# Patient Record
Sex: Male | Born: 1950
Health system: Southern US, Community
[De-identification: ages and names within clinical notes are randomized; demographics above are authoritative.]

## PROBLEM LIST (undated history)

## (undated) HISTORY — PX: VASECTOMY: SHX75

## (undated) HISTORY — PX: OTHER SURGICAL HISTORY: SHX169

---

## 2006-11-07 ENCOUNTER — Ambulatory Visit: Payer: Self-pay | Admitting: Internal Medicine

## 2006-11-07 LAB — CONVERTED CEMR LAB
ALT: 17 units/L (ref 0–40)
AST: 17 units/L (ref 0–37)
Albumin: 3.9 g/dL (ref 3.5–5.2)
Alkaline Phosphatase: 61 units/L (ref 39–117)
Basophils Relative: 0.9 % (ref 0.0–1.0)
Eosinophil percent: 0.9 % (ref 0.0–5.0)
GFR calc non Af Amer: 93 mL/min
Glomerular Filtration Rate, Af Am: 112 mL/min/{1.73_m2}
HCT: 43.3 % (ref 39.0–52.0)
HDL: 46.9 mg/dL (ref >39.0)
PSA: 1.52 ng/mL (ref 0.10–4.00)
RBC: 4.75 M/uL (ref 4.22–5.81)
RDW: 13.1 % (ref 11.5–14.6)
Sodium: 142 meq/L (ref 135–145)
Total Bilirubin: 1.3 mg/dL — ABNORMAL HIGH (ref 0.3–1.2)
Total Protein: 6.8 g/dL (ref 6.0–8.3)
VLDL: 15 mg/dL (ref 0–40)
WBC: 10.2 10*3/uL (ref 4.5–10.5)

## 2006-11-14 ENCOUNTER — Ambulatory Visit: Payer: Self-pay | Admitting: Internal Medicine

## 2006-11-22 ENCOUNTER — Ambulatory Visit: Payer: Self-pay | Admitting: Gastroenterology

## 2006-12-02 ENCOUNTER — Ambulatory Visit: Payer: Self-pay | Admitting: Gastroenterology

## 2006-12-02 ENCOUNTER — Encounter: Payer: Self-pay | Admitting: Internal Medicine

## 2006-12-02 HISTORY — PX: COLONOSCOPY: SHX174

## 2008-09-29 ENCOUNTER — Ambulatory Visit: Payer: Self-pay | Admitting: Internal Medicine

## 2008-09-29 DIAGNOSIS — R0609 Other forms of dyspnea: Secondary | ICD-10-CM

## 2008-09-29 DIAGNOSIS — R0989 Other specified symptoms and signs involving the circulatory and respiratory systems: Secondary | ICD-10-CM

## 2008-09-29 DIAGNOSIS — R209 Unspecified disturbances of skin sensation: Secondary | ICD-10-CM | POA: Insufficient documentation

## 2008-10-20 ENCOUNTER — Ambulatory Visit (HOSPITAL_BASED_OUTPATIENT_CLINIC_OR_DEPARTMENT_OTHER): Admission: RE | Admit: 2008-10-20 | Discharge: 2008-10-20 | Payer: Self-pay | Admitting: Internal Medicine

## 2008-10-20 ENCOUNTER — Encounter: Payer: Self-pay | Admitting: Internal Medicine

## 2008-11-04 ENCOUNTER — Ambulatory Visit: Payer: Self-pay | Admitting: Pulmonary Disease

## 2008-12-24 ENCOUNTER — Telehealth (INDEPENDENT_AMBULATORY_CARE_PROVIDER_SITE_OTHER): Payer: Self-pay | Admitting: *Deleted

## 2009-01-04 ENCOUNTER — Encounter: Payer: Self-pay | Admitting: Internal Medicine

## 2010-05-02 ENCOUNTER — Ambulatory Visit: Payer: Self-pay | Admitting: Internal Medicine

## 2010-05-02 DIAGNOSIS — R519 Headache, unspecified: Secondary | ICD-10-CM | POA: Insufficient documentation

## 2010-05-02 DIAGNOSIS — R51 Headache: Secondary | ICD-10-CM | POA: Insufficient documentation

## 2010-08-30 ENCOUNTER — Ambulatory Visit: Payer: Self-pay | Admitting: Internal Medicine

## 2010-08-30 DIAGNOSIS — T169XXA Foreign body in ear, unspecified ear, initial encounter: Secondary | ICD-10-CM

## 2010-08-30 LAB — CONVERTED CEMR LAB
ALT: 21 units/L (ref 0–53)
AST: 20 units/L (ref 0–37)
Basophils Relative: 0.7 % (ref 0.0–3.0)
Bilirubin, Direct: 0.1 mg/dL (ref 0.0–0.3)
Chloride: 101 meq/L (ref 96–112)
Cholesterol: 179 mg/dL (ref 0–200)
Creatinine, Ser: 0.8 mg/dL (ref 0.4–1.5)
Eosinophils Relative: 1.4 % (ref 0.0–5.0)
GFR calc non Af Amer: 103.07 mL/min (ref >60)
HCT: 42.8 % (ref 39.0–52.0)
Hemoglobin, Urine: NEGATIVE
LDL Cholesterol: 109 mg/dL — ABNORMAL HIGH (ref 0–99)
Leukocytes, UA: NEGATIVE
MCV: 92.7 fL (ref 78.0–100.0)
Monocytes Absolute: 0.7 10*3/uL (ref 0.1–1.0)
Monocytes Relative: 8.9 % (ref 3.0–12.0)
Neutrophils Relative %: 50.8 % (ref 43.0–77.0)
Nitrite: NEGATIVE
Potassium: 4.5 meq/L (ref 3.5–5.1)
RBC: 4.62 M/uL (ref 4.22–5.81)
Specific Gravity, Urine: 1.015 (ref 1.000–1.030)
Total Bilirubin: 1.2 mg/dL (ref 0.3–1.2)
Total CHOL/HDL Ratio: 3
Total Protein: 6.4 g/dL (ref 6.0–8.3)
Urine Glucose: NEGATIVE mg/dL
Urobilinogen, UA: 0.2 (ref 0.0–1.0)
VLDL: 13.4 mg/dL (ref 0.0–40.0)
WBC: 7.7 10*3/uL (ref 4.5–10.5)

## 2010-09-12 ENCOUNTER — Encounter: Payer: Self-pay | Admitting: Internal Medicine

## 2010-09-12 ENCOUNTER — Ambulatory Visit: Payer: Self-pay | Admitting: Internal Medicine

## 2010-12-05 NOTE — Assessment & Plan Note (Signed)
Summary: CPX // RS   Vital Signs:  Patient profile:   60 year old male Height:      71 inches Weight:      184 pounds Temp:     98.4 degrees F oral BP sitting:   122 / 74  (left arm) Cuff size:   regular  Vitals Entered By: Duard Brady LPN (September 12, 2010 1:19 PM) CC: cpx - doing well  Is Patient Diabetic? No   CC:  cpx - doing well .  History of Present Illness: 60 year -old patient who is seen today for a wellness exam.  He enjoys excellent health  Preventive Screening-Counseling & Management  Alcohol-Tobacco     Smoking Status: never  Allergies (verified): No Known Drug Allergies  Past History:  Past Surgical History: Colonoscopy-12/02/2006 Vasectomy spermatocele repair  Family History: Reviewed history from 05/28/2007 and no changes required. Family History High cholesterol Family History Hypertension Family History of Cardiovascular disorder Fam hx Stroke Family History of Aneurysm Aortic (Father) Family History Lung cancer Father died 94-  CVD Mother- Ca Pancreas   (5) 1   Brother- good health  Social History: Reviewed history from 05/28/2007 and no changes required. Married Former Smoker (d/c [phone] yrs) Alcohol use-yes Drug use-no Regular exercise- yardwork   Review of Systems  The patient denies anorexia, fever, weight loss, weight gain, vision loss, decreased hearing, hoarseness, chest pain, syncope, dyspnea on exertion, peripheral edema, prolonged cough, headaches, hemoptysis, abdominal pain, melena, hematochezia, severe indigestion/heartburn, hematuria, incontinence, genital sores, muscle weakness, suspicious skin lesions, transient blindness, difficulty walking, depression, unusual weight change, abnormal bleeding, enlarged lymph nodes, angioedema, breast masses, and testicular masses.    Physical Exam  General:  Well-developed,well-nourished,in no acute distress; alert,appropriate and cooperative throughout examination Head:   Normocephalic and atraumatic without obvious abnormalities. No apparent alopecia or balding. Eyes:  No corneal or conjunctival inflammation noted. EOMI. Perrla. Funduscopic exam benign, without hemorrhages, exudates or papilledema. Vision grossly normal. Ears:  External ear exam shows no significant lesions or deformities.  Otoscopic examination reveals clear canals, tympanic membranes are intact bilaterally without bulging, retraction, inflammation or discharge. Hearing is grossly normal bilaterally. Nose:  External nasal examination shows no deformity or inflammation. Nasal mucosa are pink and moist without lesions or exudates. Mouth:  Oral mucosa and oropharynx without lesions or exudates.  Teeth in good repair. Neck:  No deformities, masses, or tenderness noted. Chest Wall:  No deformities, masses, tenderness or gynecomastia noted. Breasts:  No masses or gynecomastia noted Lungs:  Normal respiratory effort, chest expands symmetrically. Lungs are clear to auscultation, no crackles or wheezes. Heart:  Normal rate and regular rhythm. S1 and S2 normal without gallop, murmur, click, rub or other extra sounds. Abdomen:  Bowel sounds positive,abdomen soft and non-tender without masses, organomegaly or hernias noted. Rectal:  No external abnormalities noted. Normal sphincter tone. No rectal masses or tenderness. Genitalia:  Testes bilaterally descended without nodularity, tenderness or masses. No scrotal masses or lesions. No penis lesions or urethral discharge. Prostate:  1+ enlarged.  1+ enlarged.   Msk:  No deformity or scoliosis noted of thoracic or lumbar spine.   Pulses:  R and L carotid,radial,femoral,dorsalis pedis and posterior tibial pulses are full and equal bilaterally Extremities:  No clubbing, cyanosis, edema, or deformity noted with normal full range of motion of all joints.   Neurologic:  No cranial nerve deficits noted. Station and gait are normal. Plantar reflexes are down-going  bilaterally. DTRs are symmetrical throughout. Sensory, motor  and coordinative functions appear intact. Skin:  Intact without suspicious lesions or rashes Cervical Nodes:  No lymphadenopathy noted Axillary Nodes:  No palpable lymphadenopathy Inguinal Nodes:  No significant adenopathy Psych:  Cognition and judgment appear intact. Alert and cooperative with normal attention span and concentration. No apparent delusions, illusions, hallucinations   Impression & Recommendations:  Problem # 1:  HEALTH SCREENING (ICD-V70.0)  Orders: EKG w/ Interpretation (93000)  Complete Medication List: 1)  No Current Rx Meds   Patient Instructions: 1)  Please schedule a follow-up appointment in 1 year. 2)  It is important that you exercise regularly at least 20 minutes 5 times a week. If you develop chest pain, have severe difficulty breathing, or feel very tired , stop exercising immediately and seek medical attention.   Orders Added: 1)  EKG w/ Interpretation [93000] 2)  Est. Patient 40-64 years [phone]

## 2010-12-05 NOTE — Assessment & Plan Note (Signed)
Summary: HEADACHES // RS   Vital Signs:  Patient profile:   60 year old male Weight:      188 pounds Temp:     98.2 degrees F oral BP sitting:   120 / 78  (right arm) Cuff size:   regular  Vitals Entered By: Duard Brady LPN (May 02, 2010 1:10 PM) CC: c/o headache on/off x3wks , base of head muscle tenderness Is Patient Diabetic? No   CC:  c/o headache on/off x3wks  and base of head muscle tenderness.  History of Present Illness: 60 -year-old patient, who presents with a  3 week history mild daily headaches.  He describes a more of a bitemporal mild headache today, but often becomes more bothersome in the posterior neck region at night.  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  No focal neurological symptoms.  He has been using ibuprofen p.r.n. with some benefit. He has been evaluated by ENT last spring  due to snoring.  He has elected not to consider surgery at this time  Preventive Screening-Counseling & Management  Alcohol-Tobacco     Smoking Status: never  Allergies (verified): No Known Drug Allergies  Past History:  Social History: Smoking Status:  never  Review of Systems       The patient complains of headaches.  The patient denies anorexia, fever, weight loss, weight gain, vision loss, decreased hearing, hoarseness, chest pain, syncope, dyspnea on exertion, peripheral edema, prolonged cough, hemoptysis, abdominal pain, melena, hematochezia, severe indigestion/heartburn, hematuria, incontinence, genital sores, muscle weakness, suspicious skin lesions, transient blindness, difficulty walking, depression, unusual weight change, abnormal bleeding, enlarged lymph nodes, angioedema, breast masses, and testicular masses.    Physical Exam  General:  Well-developed,well-nourished,in no acute distress; alert,appropriate and cooperative throughout examination Head:  Normocephalic and atraumatic without obvious  abnormalities. No apparent alopecia or balding. Eyes:  No corneal or conjunctival inflammation noted. EOMI. Perrla. Funduscopic exam benign, without hemorrhages, exudates or papilledema. Vision grossly normal. Ears:  External ear exam shows no significant lesions or deformities.  Otoscopic examination reveals clear canals, tympanic membranes are intact bilaterally without bulging, retraction, inflammation or discharge. Hearing is grossly normal bilaterally. Mouth:  Oral mucosa and oropharynx without lesions or exudates.  Teeth in good repair. Neck:  No deformities, masses, or tenderness noted. Lungs:  Normal respiratory effort, chest expands symmetrically. Lungs are clear to auscultation, no crackles or wheezes. Heart:  Normal rate and regular rhythm. S1 and S2 normal without gallop, murmur, click, rub or other extra sounds. Abdomen:  Bowel sounds positive,abdomen soft and non-tender without masses, organomegaly or hernias noted.   Impression & Recommendations:  Problem # 1:  HEADACHE (ICD-784.0)  Problem # 2:  SNORING (ICD-786.09)  Patient Instructions: 1)  Vimovo one twice dai 2)  Please schedule a follow-up appointment in 3 months for annual exam 3)  It is important that you exercise regularly at least 20 minutes 5 times a week. If you develop chest pain, have severe difficulty breathing, or feel very tired , stop exercising immediately and seek medical attention.

## 2010-12-05 NOTE — Assessment & Plan Note (Signed)
Summary: FOREIGN OBJECT IN EAR? // RS   Vital Signs:  Patient profile:   60 year old male Height:      71 inches Weight:      188 pounds BMI:     26.32 Temp:     98.2 degrees F oral Pulse rate:   72 / minute Resp:     14 per minute BP sitting:   120 / 76  (left arm)  Vitals Entered By: Willy Eddy, LPN (August 30, 2010 9:16 AM) CC: c/o left ear feeling like something in there Is Patient Diabetic? No   CC:  c/o left ear feeling like something in there.  History of Present Illness: 60 -year-old patient who is seen today for follow-up;  he presents with a chief complaint of a foreign body sensation in his left ear.  There is been no pain or hearing loss.  He is scheduled for a complete examination in the near future  Preventive Screening-Counseling & Management  Alcohol-Tobacco     Smoking Status: never  Current Problems (verified): 1)  Headache  (ICD-784.0) 2)  Snoring  (ICD-786.09) 3)  Paresthesia  (ICD-782.0) 4)  Family History of Aneurysm Aortic  (ICD-V17.4)  Current Medications (verified): 1)  None  Allergies (verified): No Known Drug Allergies  Past History:  Physical Exam  General:  Well-developed,well-nourished,in no acute distress; alert,appropriate and cooperative throughout examination Ears:  the right tympanic membrane and canal normal.  The left membrane normal; cut hair fragments were noted in the left canal.  The canal was irrigated until clear   Impression & Recommendations:  Problem # 1:  FOREIGN BODY, EAR, LEFT (ICD-931)  Patient Instructions: 1)  Please schedule a follow-up appointment as needed.   Orders Added: 1)  Est. Patient Level II [16109]

## 2011-03-20 NOTE — Procedures (Signed)
NAME:  Jermaine Collins, Jermaine Collins NO.:  [id number]   MEDICAL RECORD NO.:  [id number]          PATIENT TYPE:  OUT   LOCATION:  SLEEP CENTER                 FACILITY:  [facility]   PHYSICIAN:  Barbaraann Share, MD,FCCPDATE OF BIRTH:  July 21, 1950   DATE OF STUDY:  10/20/2008                            NOCTURNAL POLYSOMNOGRAM   REFERRING PHYSICIAN:  Gordy Savers, MD   LOCATION:  Sleep Lab.   REFERRING PHYSICIAN:  Gordy Savers, MD   INDICATION FOR THE STUDY:  Hypersomnia with sleep apnea.   EPWORTH SCORE:  2.   SLEEP ARCHITECTURE:  The patient had a total sleep time of 339 minutes  with no slow wave sleep and decreased REM.  Sleep onset latency was  prolonged at 57 minutes, and REM onset was fairly rapid at 53 minutes.  Sleep efficiency was decreased at 78%.   RESPIRATORY DATA:  The patient was found to have 17 apneas and 9  hypopneas for an apnea-hypopnea index of 5 events per hour.  The events  were worse in the supine position and there was moderate snoring noted  throughout.   OXYGEN DATA:  The patient was found to have transient O2 desaturation as  low as 89% with his occasional obstructive events.   CARDIAC DATA:  No clinically significant arrhythmias were noted.   MOVEMENT/PARASOMNIA:  The patient was found to have moderate to large  numbers of leg jerks during the night, but only one resulting in  arousal.  There were no abnormal behaviors noted.   IMPRESSION/RECOMMENDATION:  1. Very mild obstructive sleep apnea/hypopnea syndrome with an apnea-      hypopnea index of 5 events per hour and oxygen desaturation as low      as 89%.  This really does not represent a significant health risk      to the patient, and therefore treatment should be considered only      if this is impacting his quality of life.  Treatment options can      include weight loss alone if applicable, upper airway surgery, oral      appliance, and continuous positive airway pressure.   Since the      majority of this events occurred in the supine position, positional      therapy could also be considered.  2. Large numbers of leg jerks with no significant sleep disruption      noted.  However, clinical correlation is suggested if the patient      has a history suggestive of the restless leg syndrome.      Barbaraann Share, MD,FCCP  Diplomate, American Board of Sleep  Medicine  Electronically Signed     [facility]/MEDQ  D:  11/09/2008 15:49:15  T:  11/10/2008 06:00:41  Job:  [id number]

## 2011-03-23 NOTE — Assessment & Plan Note (Signed)
[facility] OFFICE NOTE   RYKAR, LEBLEU                        MRN:          [id number]  DATE:11/14/2006                            DOB:          11/04/1951    A 60 year old gentleman who is seen today to establish with our  practice.  He has enjoyed remarkably good health.  At age 60, he  underwent urological surgery for bilateral hydroceles and also a  vasectomy.  He has never had any hospital admissions.   The past 4-6 months, he has had some increasing weakness.  He has  relocated to the area about a year and a half ago and has been busy  after work hours remodeling his house.  He has no known allergies.  Takes no chronic medications.  He does smoke a cigar at nighttime daily.   REVIEW OF SYSTEMS:  Negative.  He did have a screening sigmoidoscopy  about 15 years ago.   SOCIAL HISTORY:  Married.  One son.  One daughter.   FAMILY HISTORY:  Father died at age 50.  A history of rheumatic heart  disease, status post valve repair.  He had a stroke at age 49,  cerebrovascular disease, and also had an aortic aneurysm.  Mother died  of complications of lung cancer with a history of COPD at 28.  Brother  age 4 is in good health.   PHYSICAL EXAMINATION:  GENERAL:  A well-developed, fit-appearing male in  no acute distress.  VITAL SIGNS:  Blood pressure 130/80.  HEENT:  Fundi, ears, nose, and throat clear.  NECK:  No adenopathy or bruits.  CHEST:  Clear.  CARDIOVASCULAR:  Normal heart sounds.  No murmurs.  ABDOMEN:  Benign.  GENITOURINARY:  External genitalia normal.  Left testicle slightly  atrophic.  RECTAL:  Prostate +1-2 and benign.  Stool heme negative.  EXTREMITIES:  Negative with full peripheral pulses.   EKG and laboratory studies were reviewed.  These were unremarkable.   IMPRESSION:  Unremarkable clinical exam.  Weakness.  Mild benign  prostatic hypertrophy.   DISPOSITION:  He will be  clinically observed at the present time.  Cessation of smoking was encouraged.  He will be set up for a  colonoscopy.  Will call if he does not improve, otherwise will reassess  in one year.     Gordy Savers, MD  Electronically Signed    PFK/MedQ  DD: 11/14/2006  DT: 11/14/2006  Job #: [id number]

## 2011-05-29 ENCOUNTER — Telehealth: Payer: Self-pay | Admitting: Internal Medicine

## 2011-05-29 NOTE — Telephone Encounter (Signed)
Pt called and has questions re: the shingles vax. Pt had childhood chicken pox and would like to know if Dr Amador Cunas would recommend vax? Pt aware that insurance may not cover.

## 2011-05-30 NOTE — Telephone Encounter (Signed)
Spoke with pt - informed we do incourage vaccine , but he would need to check with ins co. About cost to him . And if it can be given here vs. Retail pharmacy ([facility])

## 2011-06-06 ENCOUNTER — Encounter: Payer: Self-pay | Admitting: Internal Medicine

## 2011-06-07 ENCOUNTER — Ambulatory Visit (INDEPENDENT_AMBULATORY_CARE_PROVIDER_SITE_OTHER): Payer: BC Managed Care – PPO | Admitting: Internal Medicine

## 2011-06-07 DIAGNOSIS — Z23 Encounter for immunization: Secondary | ICD-10-CM

## 2011-06-07 DIAGNOSIS — Z0000 Encounter for general adult medical examination without abnormal findings: Secondary | ICD-10-CM

## 2011-06-07 DIAGNOSIS — Z2911 Encounter for prophylactic immunotherapy for respiratory syncytial virus (RSV): Secondary | ICD-10-CM

## 2013-01-09 ENCOUNTER — Other Ambulatory Visit: Payer: BC Managed Care – PPO

## 2013-01-13 ENCOUNTER — Other Ambulatory Visit (INDEPENDENT_AMBULATORY_CARE_PROVIDER_SITE_OTHER): Payer: BC Managed Care – PPO

## 2013-01-13 DIAGNOSIS — Z0000 Encounter for general adult medical examination without abnormal findings: Secondary | ICD-10-CM

## 2013-01-13 LAB — CBC WITH DIFFERENTIAL/PLATELET
Basophils Relative: 0.8 % (ref 0.0–3.0)
Eosinophils Relative: 1.9 % (ref 0.0–5.0)
Lymphocytes Relative: 41.2 % (ref 12.0–46.0)
MCV: 90.4 fl (ref 78.0–100.0)
Monocytes Absolute: 0.7 10*3/uL (ref 0.1–1.0)
Neutrophils Relative %: 46.8 % (ref 43.0–77.0)
Platelets: 224 10*3/uL (ref 150.0–400.0)
RBC: 4.62 Mil/uL (ref 4.22–5.81)
WBC: 7.2 10*3/uL (ref 4.5–10.5)

## 2013-01-13 LAB — BASIC METABOLIC PANEL
BUN: 13 mg/dL (ref 6–23)
Chloride: 103 mEq/L (ref 96–112)
GFR: 89.42 mL/min (ref >60.00)
Glucose, Bld: 94 mg/dL (ref 70–99)
Potassium: 3.9 mEq/L (ref 3.5–5.1)
Sodium: 138 mEq/L (ref 135–145)

## 2013-01-13 LAB — HEPATIC FUNCTION PANEL
Bilirubin, Direct: 0.2 mg/dL (ref 0.0–0.3)
Total Bilirubin: 1.6 mg/dL — ABNORMAL HIGH (ref 0.3–1.2)
Total Protein: 6.5 g/dL (ref 6.0–8.3)

## 2013-01-13 LAB — POCT URINALYSIS DIPSTICK
Bilirubin, UA: NEGATIVE
Ketones, UA: NEGATIVE
Leukocytes, UA: NEGATIVE
Nitrite, UA: NEGATIVE
Protein, UA: NEGATIVE

## 2013-01-13 LAB — LIPID PANEL
Cholesterol: 170 mg/dL (ref 0–200)
HDL: 52.9 mg/dL (ref >39.00)
LDL Cholesterol: 106 mg/dL — ABNORMAL HIGH (ref 0–99)
VLDL: 11.2 mg/dL (ref 0.0–40.0)

## 2013-01-13 LAB — PSA: PSA: 1.58 ng/mL (ref 0.10–4.00)

## 2013-01-16 ENCOUNTER — Encounter: Payer: Self-pay | Admitting: Internal Medicine

## 2013-01-16 ENCOUNTER — Ambulatory Visit (INDEPENDENT_AMBULATORY_CARE_PROVIDER_SITE_OTHER): Payer: BC Managed Care – PPO | Admitting: Internal Medicine

## 2013-01-16 VITALS — BP 140/90 | HR 80 | Temp 97.9°F | Resp 18 | Ht 71.0 in | Wt 190.0 lb

## 2013-01-16 DIAGNOSIS — Z0000 Encounter for general adult medical examination without abnormal findings: Secondary | ICD-10-CM

## 2013-01-16 NOTE — Patient Instructions (Signed)
It is important that you exercise regularly, at least 20 minutes 3 to 4 times per week.  If you develop chest pain or shortness of breath seek  medical attention.   

## 2013-01-16 NOTE — Progress Notes (Signed)
  Subjective:    Patient ID: Jermaine Collins, male    DOB: 01-Jul-1951, 62 y.o.   MRN: [id number]  HPI  CC: cpx - doing well .  History of Present Illness:   62 year -old patient who is seen today for a wellness exam. He enjoys excellent health  Preventive Screening-Counseling & Management  Alcohol-Tobacco  Smoking Status: never   Allergies (verified):  No Known Drug Allergies   Past History:  Past Surgical History:  Colonoscopy-12/02/2006  Vasectomy  spermatocele repair   Family History:  Reviewed history from 05/28/2007 and no changes required.  Family History High cholesterol  Family History Hypertension  Family History of Cardiovascular disorder  Fam hx Stroke  Family History of Aneurysm Aortic (Father)  Family History Lung cancer   Father died 63- CVD  Mother- Ca Pancreas (27)  1 Brother- good health   Social History:  Reviewed history from 05/28/2007 and no changes required.  Married  Former Smoker (d/c 1981-13 yrs)  Alcohol use-yes  Drug use-no  Regular exercise- yardwork    Review of Systems     Objective:   Physical Exam        Assessment & Plan:

## 2013-01-16 NOTE — Progress Notes (Signed)
Subjective:    Patient ID: Jermaine Collins, male    DOB: 26-Nov-1950, 62 y.o.   MRN: [id number]  HPI   CC: cpx - doing well .  History of Present Illness:   62year -old patient who is seen today for a wellness exam. He enjoys excellent health   Preventive Screening-Counseling & Management  Alcohol-Tobacco  Smoking Status: Discontinued greater than 30 years ago  Allergies (verified):  No Known Drug Allergies   Past History:  Past Surgical History:  Colonoscopy-12/02/2006  Vasectomy  spermatocele repair   Family History:  Reviewed history from 05/28/2007 and no changes required.  Family History High cholesterol  Family History Hypertension  Family History of Cardiovascular disorder  Fam hx Stroke  Family History of Aneurysm Aortic (Father)  Family History Lung cancer  Father died 65- CVD  Mother- Ca Pancreas (88)  1 Brother- good health   Social History:  Reviewed history from 05/28/2007 and no changes required.  Married  Former Smoker (d/c 1981-13 yrs)  Alcohol use-yes  Drug use-no  Regular exercise- yardwork  IT     Review of Systems  Constitutional: Negative for fever, chills, activity change, appetite change and fatigue.  HENT: Negative for hearing loss, ear pain, congestion, rhinorrhea, sneezing, mouth sores, trouble swallowing, neck pain, neck stiffness, dental problem, voice change, sinus pressure and tinnitus.   Eyes: Negative for photophobia, pain, redness and visual disturbance.  Respiratory: Negative for apnea, cough, choking, chest tightness, shortness of breath and wheezing.   Cardiovascular: Negative for chest pain, palpitations and leg swelling.  Gastrointestinal: Negative for nausea, vomiting, abdominal pain, diarrhea, constipation, blood in stool, abdominal distention, anal bleeding and rectal pain.  Genitourinary: Negative for dysuria, urgency, frequency, hematuria, flank pain, decreased urine volume, discharge, penile swelling, scrotal swelling,  difficulty urinating, genital sores and testicular pain.  Musculoskeletal: Negative for myalgias, back pain, joint swelling, arthralgias and gait problem.  Skin: Negative for color change, rash and wound.  Neurological: Negative for dizziness, tremors, seizures, syncope, facial asymmetry, speech difficulty, weakness, light-headedness, numbness and headaches.  Hematological: Negative for adenopathy. Does not bruise/bleed easily.  Psychiatric/Behavioral: Negative for suicidal ideas, hallucinations, behavioral problems, confusion, sleep disturbance, self-injury, dysphoric mood, decreased concentration and agitation. The patient is not nervous/anxious.        Objective:   Physical Exam  Constitutional: He appears well-developed and well-nourished.  HENT:  Head: Normocephalic and atraumatic.  Right Ear: External ear normal.  Left Ear: External ear normal.  Nose: Nose normal.  Mouth/Throat: Oropharynx is clear and moist.  Eyes: Conjunctivae and EOM are normal. Pupils are equal, round, and reactive to light. No scleral icterus.  Neck: Normal range of motion. Neck supple. No JVD present. No thyromegaly present.  Cardiovascular: Regular rhythm, normal heart sounds and intact distal pulses.  Exam reveals no gallop and no friction rub.   No murmur heard. Pulmonary/Chest: Effort normal and breath sounds normal. He exhibits no tenderness.  Abdominal: Soft. Bowel sounds are normal. He exhibits no distension and no mass. There is no tenderness.  Genitourinary: Prostate normal and penis normal. Guaiac negative stool.  Testes slightly atrophic left greater than the right  Musculoskeletal: Normal range of motion. He exhibits no edema and no tenderness.  Lymphadenopathy:    He has no cervical adenopathy.  Neurological: He is alert. He has normal reflexes. No cranial nerve deficit. Coordination normal.  Skin: Skin is warm and dry. No rash noted.  Psychiatric: He has a normal mood and affect. His behavior  is normal.  Assessment & Plan:   Preventive health examination  A regular exercise program encouraged Return in one year for followup

## 2014-01-18 ENCOUNTER — Ambulatory Visit: Payer: BC Managed Care – PPO | Admitting: Internal Medicine

## 2014-01-21 ENCOUNTER — Other Ambulatory Visit (INDEPENDENT_AMBULATORY_CARE_PROVIDER_SITE_OTHER): Payer: BC Managed Care – PPO

## 2014-01-21 DIAGNOSIS — Z0000 Encounter for general adult medical examination without abnormal findings: Secondary | ICD-10-CM

## 2014-01-21 LAB — CBC WITH DIFFERENTIAL/PLATELET
Basophils Absolute: 0.1 10*3/uL (ref 0.0–0.1)
Basophils Relative: 0.8 % (ref 0.0–3.0)
Eosinophils Absolute: 0.1 10*3/uL (ref 0.0–0.7)
Eosinophils Relative: 0.8 % (ref 0.0–5.0)
HCT: 44.8 % (ref 39.0–52.0)
Hemoglobin: 15 g/dL (ref 13.0–17.0)
Lymphocytes Relative: 33.9 % (ref 12.0–46.0)
Lymphs Abs: 3.1 10*3/uL (ref 0.7–4.0)
MCHC: 33.5 g/dL (ref 30.0–36.0)
MCV: 91.6 fl (ref 78.0–100.0)
Monocytes Absolute: 1.3 10*3/uL — ABNORMAL HIGH (ref 0.1–1.0)
Monocytes Relative: 14.5 % — ABNORMAL HIGH (ref 3.0–12.0)
Neutro Abs: 4.5 10*3/uL (ref 1.4–7.7)
Neutrophils Relative %: 50 % (ref 43.0–77.0)
Platelets: 211 10*3/uL (ref 150.0–400.0)
RBC: 4.89 Mil/uL (ref 4.22–5.81)
RDW: 13.5 % (ref 11.5–14.6)
WBC: 9 10*3/uL (ref 4.5–10.5)

## 2014-01-21 LAB — POCT URINALYSIS DIPSTICK
Glucose, UA: NEGATIVE
Leukocytes, UA: NEGATIVE
Nitrite, UA: NEGATIVE
SPEC GRAV UA: 1.02
Urobilinogen, UA: 0.2
pH, UA: 6.5

## 2014-01-21 LAB — BASIC METABOLIC PANEL
BUN: 8 mg/dL (ref 6–23)
CALCIUM: 9.4 mg/dL (ref 8.4–10.5)
CO2: 24 meq/L (ref 19–32)
CREATININE: 1.1 mg/dL (ref 0.4–1.5)
Chloride: 104 mEq/L (ref 96–112)
GFR: 72.37 mL/min (ref >60.00)
GLUCOSE: 94 mg/dL (ref 70–99)
Potassium: 5.7 mEq/L — ABNORMAL HIGH (ref 3.5–5.1)
Sodium: 141 mEq/L (ref 135–145)

## 2014-01-21 LAB — LIPID PANEL
CHOL/HDL RATIO: 3
Cholesterol: 169 mg/dL (ref 0–200)
HDL: 53 mg/dL (ref >39.00)
LDL Cholesterol: 104 mg/dL — ABNORMAL HIGH (ref 0–99)
Triglycerides: 61 mg/dL (ref 0.0–149.0)
VLDL: 12.2 mg/dL (ref 0.0–40.0)

## 2014-01-21 LAB — HEPATIC FUNCTION PANEL
ALT: 22 U/L (ref 0–53)
AST: 22 U/L (ref 0–37)
Albumin: 4.5 g/dL (ref 3.5–5.2)
Alkaline Phosphatase: 62 U/L (ref 39–117)
Bilirubin, Direct: 0.2 mg/dL (ref 0.0–0.3)
Total Bilirubin: 1.2 mg/dL (ref 0.3–1.2)
Total Protein: 7.1 g/dL (ref 6.0–8.3)

## 2014-01-21 LAB — PSA: PSA: 1.23 ng/mL (ref 0.10–4.00)

## 2014-01-21 LAB — TSH: TSH: 3.16 u[IU]/mL (ref 0.35–5.50)

## 2014-01-26 ENCOUNTER — Encounter: Payer: Self-pay | Admitting: Internal Medicine

## 2014-01-26 ENCOUNTER — Ambulatory Visit (INDEPENDENT_AMBULATORY_CARE_PROVIDER_SITE_OTHER): Payer: BC Managed Care – PPO | Admitting: Internal Medicine

## 2014-01-26 ENCOUNTER — Encounter: Payer: BC Managed Care – PPO | Admitting: Internal Medicine

## 2014-01-26 VITALS — BP 118/76 | HR 88 | Temp 98.2°F | Resp 20 | Ht 71.0 in | Wt 187.0 lb

## 2014-01-26 DIAGNOSIS — Z23 Encounter for immunization: Secondary | ICD-10-CM

## 2014-01-26 DIAGNOSIS — Z0000 Encounter for general adult medical examination without abnormal findings: Secondary | ICD-10-CM

## 2014-01-26 NOTE — Progress Notes (Signed)
Subjective:    Patient ID: Jermaine Collins, male    DOB: [birth year], 63 y.o.   MRN: [id number]  HPI    CC: cpx - doing well .  History of Present Illness:   63 year -old patient who is seen today for a wellness exam. He enjoys excellent health   Preventive Screening-Counseling & Management  Alcohol-Tobacco  Smoking Status: Discontinued greater than 30 years ago  Allergies (verified):  No Known Drug Allergies   Past History:  Past Surgical History:  Colonoscopy-12/02/2006  Vasectomy  spermatocele repair   Family History:   Family History High cholesterol  Family History Hypertension  Family History of Cardiovascular disorder  Fam hx Stroke  Family History of Aneurysm Aortic (Father)  Family History Lung cancer  Father died 5080- CVD  Mother- Ca Pancreas (4975)  1 Brother- good health   Social History:   Married  Former Smoker (d/c 1981-13 yrs)  Alcohol use-yes  Drug use-no  Regular exercise- yardwork  IT     Review of Systems  Constitutional: Negative for fever, chills, activity change, appetite change and fatigue.  HENT: Negative for congestion, dental problem, ear pain, hearing loss, mouth sores, rhinorrhea, sinus pressure, sneezing, tinnitus, trouble swallowing and voice change.   Eyes: Negative for photophobia, pain, redness and visual disturbance.  Respiratory: Negative for apnea, cough, choking, chest tightness, shortness of breath and wheezing.   Cardiovascular: Negative for chest pain, palpitations and leg swelling.  Gastrointestinal: Negative for nausea, vomiting, abdominal pain, diarrhea, constipation, blood in stool, abdominal distention, anal bleeding and rectal pain.  Genitourinary: Negative for dysuria, urgency, frequency, hematuria, flank pain, decreased urine volume, discharge, penile swelling, scrotal swelling, difficulty urinating, genital sores and testicular pain.  Musculoskeletal: Negative for arthralgias, back pain, gait problem, joint swelling,  myalgias, neck pain and neck stiffness.  Skin: Negative for color change, rash and wound.  Neurological: Negative for dizziness, tremors, seizures, syncope, facial asymmetry, speech difficulty, weakness, light-headedness, numbness and headaches.  Hematological: Negative for adenopathy. Does not bruise/bleed easily.  Psychiatric/Behavioral: Negative for suicidal ideas, hallucinations, behavioral problems, confusion, sleep disturbance, self-injury, dysphoric mood, decreased concentration and agitation. The patient is not nervous/anxious.        Objective:   Physical Exam  Constitutional: He appears well-developed and well-nourished.  HENT:  Head: Normocephalic and atraumatic.  Right Ear: External ear normal.  Left Ear: External ear normal.  Nose: Nose normal.  Mouth/Throat: Oropharynx is clear and moist.  Eyes: Conjunctivae and EOM are normal. Pupils are equal, round, and reactive to light. No scleral icterus.  Neck: Normal range of motion. Neck supple. No JVD present. No thyromegaly present.  Cardiovascular: Regular rhythm, normal heart sounds and intact distal pulses.  Exam reveals no gallop and no friction rub.   No murmur heard. Pulmonary/Chest: Effort normal and breath sounds normal. He exhibits no tenderness.  Abdominal: Soft. Bowel sounds are normal. He exhibits no distension and no mass. There is no tenderness.  Genitourinary: Prostate normal and penis normal. Guaiac negative stool.  Testes slightly atrophic left greater than the right  Musculoskeletal: Normal range of motion. He exhibits no edema and no tenderness.  Lymphadenopathy:    He has no cervical adenopathy.  Neurological: He is alert. He has normal reflexes. No cranial nerve deficit. Coordination normal.  Skin: Skin is warm and dry. No rash noted.  Psychiatric: He has a normal mood and affect. His behavior is normal.          Assessment & Plan:   Preventive  health examination  A regular exercise program  encouraged Return in one year for followup

## 2014-01-26 NOTE — Progress Notes (Signed)
Pre-visit discussion using our clinic review tool. No additional management support is needed unless otherwise documented below in the visit note.  

## 2014-01-26 NOTE — Patient Instructions (Signed)
It is important that you exercise regularly, at least 20 minutes 3 to 4 times per week.  If you develop chest pain or shortness of breath seek  medical attention.  Return in one year for follow-up  Health Maintenance, Males A healthy lifestyle and preventative care can promote health and wellness.  Maintain regular health, dental, and eye exams.  Eat a healthy diet. Foods like vegetables, fruits, whole grains, low-fat dairy products, and lean protein foods contain the nutrients you need and are low in calories. Decrease your intake of foods high in solid fats, added sugars, and salt. Get information about a proper diet from your health care provider, if necessary.  Regular physical exercise is one of the most important things you can do for your health. Most adults should get at least 150 minutes of moderate-intensity exercise (any activity that increases your heart rate and causes you to sweat) each week. In addition, most adults need muscle-strengthening exercises on 2 or more days a week.   Maintain a healthy weight. The body mass index (BMI) is a screening tool to identify possible weight problems. It provides an estimate of body fat based on height and weight. Your health care provider can find your BMI and can help you achieve or maintain a healthy weight. For males 20 years and older:  A BMI below 18.5 is considered underweight.  A BMI of 18.5 to 24.9 is normal.  A BMI of 25 to 29.9 is considered overweight.  A BMI of 30 and above is considered obese.  Maintain normal blood lipids and cholesterol by exercising and minimizing your intake of saturated fat. Eat a balanced diet with plenty of fruits and vegetables. Blood tests for lipids and cholesterol should begin at age 64 and be repeated every 5 years. If your lipid or cholesterol levels are high, you are over 50, or you are at high risk for heart disease, you may need your cholesterol levels checked more frequently.Ongoing high  lipid and cholesterol levels should be treated with medicines, if diet and exercise are not working.  If you smoke, find out from your health care provider how to quit. If you do not use tobacco, do not start.  Lung cancer screening is recommended for adults aged 64 80 years who are at high risk for developing lung cancer because of a history of smoking. A yearly low-dose CT scan of the lungs is recommended for people who have at least a 30-pack-year history of smoking and are a current smoker or have quit within the past 15 years. A pack year of smoking is smoking an average of 1 pack of cigarettes a day for 1 year (for example, a 30-pack-year history of smoking could mean smoking 1 pack a day for 30 years or 2 packs a day for 15 years). Yearly screening should continue until the smoker has stopped smoking for at least 15 years. Yearly screening should be stopped for people who develop a health problem that would prevent them from having lung cancer treatment.  If you choose to drink alcohol, do not have more than 2 drinks per day. One drink is considered to be 12 oz (360 mL) of beer, 5 oz (150 mL) of wine, or 1.5 oz (45 mL) of liquor.  Avoid use of street drugs. Do not share needles with anyone. Ask for help if you need support or instructions about stopping the use of drugs.  High blood pressure causes heart disease and increases the risk of stroke.  Blood pressure should be checked at least every 1 2 years. Ongoing high blood pressure should be treated with medicines if weight loss and exercise are not effective.  If you are 66 64 years old, ask your health care provider if you should take aspirin to prevent heart disease.  Diabetes screening involves taking a blood sample to check your fasting blood sugar level. This should be done once every 3 years after age 21, if you are at a normal weight and without risk factors for diabetes. Testing should be considered at a younger age or be carried out  more frequently if you are overweight and have at least 1 risk factor for diabetes.  Colorectal cancer can be detected and often prevented. Most routine colorectal cancer screening begins at the age of 53 and continues through age 51. However, your health care provider may recommend screening at an earlier age if you have risk factors for colon cancer. On a yearly basis, your health care provider may provide home test kits to check for hidden blood in the stool. A small camera at the end of a tube may be used to directly examine the colon (sigmoidoscopy or colonoscopy) to detect the earliest forms of colorectal cancer. Talk to your health care provider about this at age 75, when routine screening begins. A direct exam of the colon should be repeated every 5 10 years through age 43, unless early forms of pre-cancerous polyps or small growths are found.  People who are at an increased risk for hepatitis B should be screened for this virus. You are considered at high risk for hepatitis B if:  You were born in a country where hepatitis B occurs often. Talk with your health care provider about which countries are considered high-risk.  Your parents were born in a high-risk country and you have not received a shot to protect against hepatitis B (hepatitis B vaccine).  You have HIV or AIDS.  You use needles to inject street drugs.  You live with, or have sex with, someone who has hepatitis B.  You are a man who has sex with other men (MSM).  You get hemodialysis treatment.  You take certain medicines for conditions like cancer, organ transplantation, and autoimmune conditions.  Hepatitis C blood testing is recommended for all people born from [birth year] through 1965 and any individual with known risk factors for hepatitis C.  Healthy men should no longer receive prostate-specific antigen (PSA) blood tests as part of routine cancer screening. Talk to your health care provider about prostate cancer  screening.  Testicular cancer screening is not recommended for adolescents or adult males who have no symptoms. Screening includes self-exam, a health care provider exam, and other screening tests. Consult with your health care provider about any symptoms you have or any concerns you have about testicular cancer.  Practice safe sex. Use condoms and avoid high-risk sexual practices to reduce the spread of sexually transmitted infections (STIs).  Use sunscreen. Apply sunscreen liberally and repeatedly throughout the day. You should seek shade when your shadow is shorter than you. Protect yourself by wearing long sleeves, pants, a wide-brimmed hat, and sunglasses year round, whenever you are outdoors.  Tell your health care provider of new moles or changes in moles, especially if there is a change in shape or color. Also tell your provider if a mole is larger than the size of a pencil eraser.  A one-time screening for abdominal aortic aneurysm (AAA) and surgical repair of large  AAAs by ultrasound is recommended for men aged 45 75 years who are current or former smokers.  Stay current with your vaccines (immunizations). Document Released: 04/19/2008 Document Revised: 08/12/2013 Document Reviewed: 03/19/2011 [facility] Patient Information 2014 [place], [place].

## 2014-03-09 ENCOUNTER — Ambulatory Visit (INDEPENDENT_AMBULATORY_CARE_PROVIDER_SITE_OTHER): Payer: BC Managed Care – PPO | Admitting: Internal Medicine

## 2014-03-09 ENCOUNTER — Encounter: Payer: Self-pay | Admitting: Internal Medicine

## 2014-03-09 VITALS — BP 138/80 | HR 82 | Temp 98.6°F | Resp 20 | Ht 71.0 in | Wt 186.0 lb

## 2014-03-09 DIAGNOSIS — S76219A Strain of adductor muscle, fascia and tendon of unspecified thigh, initial encounter: Secondary | ICD-10-CM

## 2014-03-09 DIAGNOSIS — IMO0002 Reserved for concepts with insufficient information to code with codable children: Secondary | ICD-10-CM

## 2014-03-09 NOTE — Progress Notes (Signed)
   Subjective:    Patient ID: Jermaine Collins, male    DOB: [birth year], 63 y.o.   MRN: [id number]  HPI 63 year old patient who is seen today with a chief complaint of pain in the left groin area.  He has been quite busy trying to get a house ready to sell and has been doing considerable amounts of lifting, bending, and stooping.  He has had pain in the left groin area.  It has been aggravated by certain movements.  No nocturnal pain.  No obvious mass in the left groin area  History reviewed. No pertinent past medical history.  History   Social History  . Marital Status: Married    Spouse Name: N/A    Number of Children: N/A  . Years of Education: N/A   Occupational History  . Not on file.   Social History Main Topics  . Smoking status: Former Games developermoker  . Smokeless tobacco: Never Used  . Alcohol Use: 7.2 oz/week    12 Cans of beer per week  . Drug Use: No  . Sexual Activity: Not on file   Other Topics Concern  . Not on file   Social History Narrative  . No narrative on file    Past Surgical History  Procedure Laterality Date  . Vasectomy    . Colonoscopy  12/02/06  . Spermatocele repair      Family History  Problem Relation Age of Onset  . Cancer Mother     pancreas  . Aortic aneurysm Father   . Heart disease Father   . Hyperlipidemia Other   . Hypertension Other   . Heart disease Other   . Stroke Other   . Cancer Other     lung    No Known Allergies  Current Outpatient Prescriptions on File Prior to Visit  Medication Sig Dispense Refill  . Multiple Vitamin (MULTIVITAMIN) tablet Take 1 tablet by mouth daily.       No current facility-administered medications on file prior to visit.    BP 138/80  Pulse 82  Temp(Src) 98.6 F (37 C) (Oral)  Resp 20  Ht 5\' 11"  (1.803 m)  Wt 186 lb (84.369 kg)  BMI 25.95 kg/m2  SpO2 98%       Review of Systems  Gastrointestinal: Positive for abdominal pain.       Objective:   Physical Exam  Constitutional: He  appears well-developed and well-nourished. No distress.  Genitourinary: Penis normal.  Mild tenderness in the left groin area.  No obvious hernia. Palpation through the left inguinal canal did not cause any discomfort          Assessment & Plan:   Left groin strain.  Additional attempts to moderate his activities.  Take Advil when necessary.  Will call if he does not have prompt clinical improvement

## 2014-03-09 NOTE — Progress Notes (Signed)
Pre-visit discussion using our clinic review tool. No additional management support is needed unless otherwise documented below in the visit note.  

## 2014-03-09 NOTE — Patient Instructions (Signed)
You  may move around, but avoid painful motions and activities.   Take 400-600 mg of ibuprofen ( Advil, Motrin) with food every 4 to 6 hours as needed for pain relief or control of fever

## 2014-05-04 ENCOUNTER — Encounter: Payer: Self-pay | Admitting: Internal Medicine

## 2014-05-04 ENCOUNTER — Ambulatory Visit (INDEPENDENT_AMBULATORY_CARE_PROVIDER_SITE_OTHER): Payer: BC Managed Care – PPO | Admitting: Internal Medicine

## 2014-05-04 VITALS — BP 130/90 | HR 71 | Temp 98.4°F | Resp 20 | Ht 71.0 in | Wt 181.0 lb

## 2014-05-04 DIAGNOSIS — M25539 Pain in unspecified wrist: Secondary | ICD-10-CM

## 2014-05-04 DIAGNOSIS — M25532 Pain in left wrist: Secondary | ICD-10-CM

## 2014-05-04 NOTE — Progress Notes (Signed)
Subjective:    Patient ID: Jermaine Collins, male    DOB: 07-12-1951, 63 y.o.   MRN: [id number]  HPI    63 year old patient, who presents today with a chief complaint of pain in his nondominant left wrist.  He has been involved in moving out of one house and selling moving into another  house and has been quite active with a  number of activities. Pain is maximal, involving the dorsal lateral wrist area.  He states that it is actually much improved today  History reviewed. No pertinent past medical history.  History   Social History  . Marital Status: Married    Spouse Name: N/A    Number of Children: N/A  . Years of Education: N/A   Occupational History  . Not on file.   Social History Main Topics  . Smoking status: Former Games developermoker  . Smokeless tobacco: Never Used  . Alcohol Use: 7.2 oz/week    12 Cans of beer per week  . Drug Use: No  . Sexual Activity: Not on file   Other Topics Concern  . Not on file   Social History Narrative  . No narrative on file    Past Surgical History  Procedure Laterality Date  . Vasectomy    . Colonoscopy  12/02/06  . Spermatocele repair      Family History  Problem Relation Age of Onset  . Cancer Mother     pancreas  . Aortic aneurysm Father   . Heart disease Father   . Hyperlipidemia Other   . Hypertension Other   . Heart disease Other   . Stroke Other   . Cancer Other     lung    No Known Allergies  Current Outpatient Prescriptions on File Prior to Visit  Medication Sig Dispense Refill  . Multiple Vitamin (MULTIVITAMIN) tablet Take 1 tablet by mouth daily.       No current facility-administered medications on file prior to visit.    BP 130/90  Pulse 71  Temp(Src) 98.4 F (36.9 C) (Oral)  Resp 20  Ht 5\' 11"  (1.803 m)  Wt 181 lb (82.101 kg)  BMI 25.26 kg/m2  SpO2 99%     Review of Systems  Constitutional: Negative for fever, chills, appetite change and fatigue.  HENT: Negative for congestion, dental problem,  ear pain, hearing loss, sore throat, tinnitus, trouble swallowing and voice change.   Eyes: Negative for pain, discharge and visual disturbance.  Respiratory: Negative for cough, chest tightness, wheezing and stridor.   Cardiovascular: Negative for chest pain, palpitations and leg swelling.  Gastrointestinal: Negative for nausea, vomiting, abdominal pain, diarrhea, constipation, blood in stool and abdominal distention.  Genitourinary: Negative for urgency, hematuria, flank pain, discharge, difficulty urinating and genital sores.  Musculoskeletal: Negative for arthralgias, back pain, gait problem, joint swelling, myalgias and neck stiffness.       Left lateral wrist pain.    Skin: Negative for rash.  Neurological: Negative for dizziness, syncope, speech difficulty, weakness, numbness and headaches.  Hematological: Negative for adenopathy. Does not bruise/bleed easily.  Psychiatric/Behavioral: Negative for behavioral problems and dysphoric mood. The patient is not nervous/anxious.        Objective:   Physical Exam  Constitutional: He appears well-developed and well-nourished. No distress.  Musculoskeletal:  The left wrist appear normal.  No point tenderness or signs of active inflammation.  Valgus and varus stress did not tend to aggravate the pain.  No pain with flexion and extension of the wrist  against resistance          Assessment & Plan:   Left wrist pain.  Overuse syndrome.  Pain is much improved, today.  We'll continue ibuprofen if needed when necessary and observe at this point.  He will call if this intensifies

## 2014-05-04 NOTE — Patient Instructions (Signed)
Call or return to clinic prn if these symptoms worsen or fail to improve as anticipated.

## 2014-05-04 NOTE — Progress Notes (Signed)
Pre visit review using our clinic review tool, if applicable. No additional management support is needed unless otherwise documented below in the visit note. 

## 2015-10-03 ENCOUNTER — Telehealth: Payer: Self-pay | Admitting: *Deleted

## 2015-10-03 NOTE — Telephone Encounter (Signed)
Called patient and he states he went to [facility] in [place] and received a few stitches in finger. Overall is doing well today. Advised to call office if need anything. Verbalized understanding.   ----------------------------------------------------------------------------------------------------------------------------------------------------------------------------------------------------------------- PLEASE NOTE: All timestamps contained within this report are represented as [place] Standard Time. CONFIDENTIALTY NOTICE: This fax transmission is intended only for the addressee. It contains information that is legally privileged, confidential or otherwise protected from use or disclosure. If you are not the intended recipient, you are strictly prohibited from reviewing, disclosing, copying using or disseminating any of this information or taking any action in reliance on or regarding this information. If you have received this fax in error, please notify us immediately by telephone so that we can arrange for its return to us. Phone: [phone], Toll-Free: [phone], Fax: [phone] Page: 1 of 1 Call Id: [id number] [facility] Night - Client TELEPHONE ADVICE RECORD [facility] Patient Name: Jermaine LeveringJOSEPH Neuner Gender: Male DOB: November 05, 1951 Age: [age] Y 9 M 9 D Return Phone Number: [phone] (Primary) Address: City/State/Zip: [place] Client [facility] Night - Client Client Site [facility] - Night Physician Derryl HarborKwiatkowski, Pete Contact Type Call Call Type Triage / Clinical Relationship To Patient Self Return Phone Number [phone] (Primary) Chief Complaint Cuts and Lacerations Initial Comment Caller states he cut his finger and it's bleeding pretty bad, may need to be seen at an [facility] Nurse Assessment Guidelines Guideline Title Affirmed Question Affirmed Notes Nurse Date/Time (Eastern Time) Disp. Time  Lamount Cohen(Eastern Time) Disposition Final User 09/30/2015 2:22:02 PM Send To Clinical Follow Up Darrick PennaQueue Green, Amy 09/30/2015 4:23:48 PM Attempt made - message left Holztrager, RN, Marcelino DusterMichelle 09/30/2015 4:38:04 PM FINAL ATTEMPT MADE - no message left Yes Holztrager, RN, Marcelino DusterMichelle After Care Instructions Given Call Event Type User Date / Time Description

## 2015-10-11 ENCOUNTER — Ambulatory Visit (INDEPENDENT_AMBULATORY_CARE_PROVIDER_SITE_OTHER): Payer: BC Managed Care – PPO | Admitting: Family Medicine

## 2015-10-11 ENCOUNTER — Encounter: Payer: Self-pay | Admitting: Family Medicine

## 2015-10-11 VITALS — BP 138/88 | HR 75 | Temp 97.5°F | Ht 71.0 in | Wt 189.3 lb

## 2015-10-11 DIAGNOSIS — S61209S Unspecified open wound of unspecified finger without damage to nail, sequela: Secondary | ICD-10-CM

## 2015-10-11 NOTE — Progress Notes (Signed)
Pre visit review using our clinic review tool, if applicable. No additional management support is needed unless otherwise documented below in the visit note. 

## 2015-10-11 NOTE — Progress Notes (Signed)
  HPI:  Jermaine Collins is a pleasant 64 yo whom soffered a lac to the R dorsal thumb about 1;5 weeks ago. [facility] and 4 sutures were placed. He reports has healed well. No drainage, numbness, redness or swelling.  ROS: See pertinent positives and negatives per HPI.  No past medical history on file.  Past Surgical History  Procedure Laterality Date  . Vasectomy    . Colonoscopy  12/02/06  . Spermatocele repair      Family History  Problem Relation Age of Onset  . Cancer Mother     pancreas  . Aortic aneurysm Father   . Heart disease Father   . Hyperlipidemia Other   . Hypertension Other   . Heart disease Other   . Stroke Other   . Cancer Other     lung    Social History   Social History  . Marital Status: Married    Spouse Name: N/A  . Number of Children: N/A  . Years of Education: N/A   Social History Main Topics  . Smoking status: Former Games developermoker  . Smokeless tobacco: Never Used  . Alcohol Use: 7.2 oz/week    12 Cans of beer per week  . Drug Use: No  . Sexual Activity: Not Asked   Other Topics Concern  . None   Social History Narrative     Current outpatient prescriptions:  .  ibuprofen (ADVIL,MOTRIN) 200 MG tablet, Take 400 mg by mouth as needed., Disp: , Rfl:  .  Multiple Vitamin (MULTIVITAMIN) tablet, Take 1 tablet by mouth daily., Disp: , Rfl:   EXAM:  Filed Vitals:   10/11/15 1622  BP: 138/88  Pulse: 75  Temp: 97.5 F (36.4 C)    Body mass index is 26.41 kg/(m^2).  GENERAL: vitals reviewed and listed above, alert, oriented, appears well hydrated and in no acute distress  SKIN: healing lac R thumb - 4 surtures, minimal gapping wound edges medially, but appears well healed, no signs of infection  PSYCH: pleasant and cooperative, no obvious depression or anxiety  ASSESSMENT AND PLAN:  Discussed the following assessment and plan:  Wound, open, finger, sequela  -stitches removed, steri strip applied,  wound care and return precautions discussed -Patient advised to return or notify a doctor immediately if symptoms worsen or persist or new concerns arise.  There are no Patient Instructions on file for this visit.   Kriste BasqueKIM, Karlina Suares R.

## 2016-09-26 ENCOUNTER — Encounter: Payer: Self-pay | Admitting: Gastroenterology

## 2017-06-24 ENCOUNTER — Encounter: Payer: Self-pay | Admitting: Internal Medicine

## 2017-06-24 ENCOUNTER — Ambulatory Visit (INDEPENDENT_AMBULATORY_CARE_PROVIDER_SITE_OTHER): Payer: Medicare Other | Admitting: Internal Medicine

## 2017-06-24 VITALS — BP 142/70 | HR 69 | Temp 97.8°F | Ht 71.0 in | Wt 185.8 lb

## 2017-06-24 DIAGNOSIS — Z0000 Encounter for general adult medical examination without abnormal findings: Secondary | ICD-10-CM

## 2017-06-24 LAB — CBC WITH DIFFERENTIAL/PLATELET
BASOS ABS: 0.1 10*3/uL (ref 0.0–0.1)
BASOS PCT: 0.9 % (ref 0.0–3.0)
Eosinophils Absolute: 0.1 10*3/uL (ref 0.0–0.7)
Eosinophils Relative: 1.6 % (ref 0.0–5.0)
HEMATOCRIT: 43.4 % (ref 39.0–52.0)
HEMOGLOBIN: 14.6 g/dL (ref 13.0–17.0)
LYMPHS PCT: 38.7 % (ref 12.0–46.0)
Lymphs Abs: 2.4 10*3/uL (ref 0.7–4.0)
MCHC: 33.6 g/dL (ref 30.0–36.0)
MCV: 93.5 fl (ref 78.0–100.0)
MONO ABS: 0.6 10*3/uL (ref 0.1–1.0)
Monocytes Relative: 9.4 % (ref 3.0–12.0)
Neutro Abs: 3.1 10*3/uL (ref 1.4–7.7)
Neutrophils Relative %: 49.4 % (ref 43.0–77.0)
Platelets: 215 10*3/uL (ref 150.0–400.0)
RBC: 4.64 Mil/uL (ref 4.22–5.81)
RDW: 13.9 % (ref 11.5–15.5)
WBC: 6.3 10*3/uL (ref 4.0–10.5)

## 2017-06-24 LAB — COMPREHENSIVE METABOLIC PANEL
ALK PHOS: 63 U/L (ref 39–117)
ALT: 19 U/L (ref 0–53)
AST: 20 U/L (ref 0–37)
Albumin: 4.1 g/dL (ref 3.5–5.2)
BUN: 9 mg/dL (ref 6–23)
CALCIUM: 9.3 mg/dL (ref 8.4–10.5)
CHLORIDE: 105 meq/L (ref 96–112)
CO2: 26 mEq/L (ref 19–32)
CREATININE: 0.88 mg/dL (ref 0.40–1.50)
GFR: 91.67 mL/min (ref >60.00)
Glucose, Bld: 93 mg/dL (ref 70–99)
Potassium: 4 mEq/L (ref 3.5–5.1)
SODIUM: 140 meq/L (ref 135–145)
TOTAL PROTEIN: 7.1 g/dL (ref 6.0–8.3)
Total Bilirubin: 1.5 mg/dL — ABNORMAL HIGH (ref 0.2–1.2)

## 2017-06-24 LAB — LIPID PANEL
Cholesterol: 162 mg/dL (ref 0–200)
HDL: 70 mg/dL (ref >39.00)
LDL Cholesterol: 78 mg/dL (ref 0–99)
NonHDL: 91.77
Total CHOL/HDL Ratio: 2
Triglycerides: 69 mg/dL (ref 0.0–149.0)
VLDL: 13.8 mg/dL (ref 0.0–40.0)

## 2017-06-24 LAB — TSH: TSH: 2.34 u[IU]/mL (ref 0.35–4.50)

## 2017-06-24 NOTE — Patient Instructions (Addendum)
Limit your sodium (Salt) intake  Please check your blood pressure on a regular basis.  If it is consistently greater than 150/90, please make an office appointment.  Return in 4 weeks for follow-up  Schedule your colonoscopy to help detect colon cancer.     DASH Eating Plan DASH stands for "Dietary Approaches to Stop Hypertension." The DASH eating plan is a healthy eating plan that has been shown to reduce high blood pressure (hypertension). It may also reduce your risk for type 2 diabetes, heart disease, and stroke. The DASH eating plan may also help with weight loss. What are tips for following this plan? General guidelines  Avoid eating more than 2,300 mg (milligrams) of salt (sodium) a day. If you have hypertension, you may need to reduce your sodium intake to 1,500 mg a day.  Limit alcohol intake to no more than 1 drink a day for nonpregnant women and 2 drinks a day for men. One drink equals 12 oz of beer, 5 oz of wine, or 1 oz of hard liquor.  Work with your health care provider to maintain a healthy body weight or to lose weight. Ask what an ideal weight is for you.  Get at least 30 minutes of exercise that causes your heart to beat faster (aerobic exercise) most days of the week. Activities may include walking, swimming, or biking.  Work with your health care provider or diet and nutrition specialist (dietitian) to adjust your eating plan to your individual calorie needs. Reading food labels  Check food labels for the amount of sodium per serving. Choose foods with less than 5 percent of the Daily Value of sodium. Generally, foods with less than 300 mg of sodium per serving fit into this eating plan.  To find whole grains, look for the word "whole" as the first word in the ingredient list. Shopping  Buy products labeled as "low-sodium" or "no salt added."  Buy fresh foods. Avoid canned foods and premade or frozen meals. Cooking  Avoid adding salt when cooking. Use  salt-free seasonings or herbs instead of table salt or sea salt. Check with your health care provider or pharmacist before using salt substitutes.  Do not fry foods. Cook foods using healthy methods such as baking, boiling, grilling, and broiling instead.  Cook with heart-healthy oils, such as olive, canola, soybean, or sunflower oil. Meal planning   Eat a balanced diet that includes: ? 5 or more servings of fruits and vegetables each day. At each meal, try to fill half of your plate with fruits and vegetables. ? Up to 6-8 servings of whole grains each day. ? Less than 6 oz of lean meat, poultry, or fish each day. A 3-oz serving of meat is about the same size as a deck of cards. One egg equals 1 oz. ? 2 servings of low-fat dairy each day. ? A serving of nuts, seeds, or beans 5 times each week. ? Heart-healthy fats. Healthy fats called Omega-3 fatty acids are found in foods such as flaxseeds and coldwater fish, like sardines, salmon, and mackerel.  Limit how much you eat of the following: ? Canned or prepackaged foods. ? Food that is high in trans fat, such as fried foods. ? Food that is high in saturated fat, such as fatty meat. ? Sweets, desserts, sugary drinks, and other foods with added sugar. ? Full-fat dairy products.  Do not salt foods before eating.  Try to eat at least 2 vegetarian meals each week.  Eat more  home-cooked food and less restaurant, buffet, and fast food.  When eating at a restaurant, ask that your food be prepared with less salt or no salt, if possible. What foods are recommended? The items listed may not be a complete list. Talk with your dietitian about what dietary choices are best for you. Grains Whole-grain or whole-wheat bread. Whole-grain or whole-wheat pasta. Brown rice. [place]. Bulgur. Whole-grain and low-sodium cereals. Pita bread. Low-fat, low-sodium crackers. Whole-wheat flour tortillas. Vegetables Fresh or frozen vegetables (raw, steamed,  roasted, or grilled). Low-sodium or reduced-sodium tomato and vegetable juice. Low-sodium or reduced-sodium tomato sauce and tomato paste. Low-sodium or reduced-sodium canned vegetables. Fruits All fresh, dried, or frozen fruit. Canned fruit in natural juice (without added sugar). Meat and other protein foods Skinless chicken or [place]. Ground chicken or [place]. Pork with fat trimmed off. Fish and seafood. Egg whites. Dried beans, peas, or lentils. Unsalted nuts, nut butters, and seeds. Unsalted canned beans. Lean cuts of beef with fat trimmed off. Low-sodium, lean deli meat. Dairy Low-fat (1%) or fat-free (skim) milk. Fat-free, low-fat, or reduced-fat cheeses. Nonfat, low-sodium ricotta or cottage cheese. Low-fat or nonfat yogurt. Low-fat, low-sodium cheese. Fats and oils Soft margarine without trans fats. Vegetable oil. Low-fat, reduced-fat, or light mayonnaise and salad dressings (reduced-sodium). Canola, safflower, olive, soybean, and sunflower oils. Avocado. Seasoning and other foods Herbs. Spices. Seasoning mixes without salt. Unsalted popcorn and pretzels. Fat-free sweets. What foods are not recommended? The items listed may not be a complete list. Talk with your dietitian about what dietary choices are best for you. Grains Baked goods made with fat, such as croissants, muffins, or some breads. Dry pasta or rice meal packs. Vegetables Creamed or fried vegetables. Vegetables in a cheese sauce. Regular canned vegetables (not low-sodium or reduced-sodium). Regular canned tomato sauce and paste (not low-sodium or reduced-sodium). Regular tomato and vegetable juice (not low-sodium or reduced-sodium). Angie Fava. Olives. Fruits Canned fruit in a light or heavy syrup. Fried fruit. Fruit in cream or butter sauce. Meat and other protein foods Fatty cuts of meat. Ribs. Fried meat. Berniece Salines. Sausage. Bologna and other processed lunch meats. Salami. Fatback. Hotdogs. Bratwurst. Salted nuts and seeds. Canned  beans with added salt. Canned or smoked fish. Whole eggs or egg yolks. Chicken or [place] with skin. Dairy Whole or 2% milk, cream, and half-and-half. Whole or full-fat cream cheese. Whole-fat or sweetened yogurt. Full-fat cheese. Nondairy creamers. Whipped toppings. Processed cheese and cheese spreads. Fats and oils Butter. Stick margarine. Lard. Shortening. Ghee. Bacon fat. Tropical oils, such as coconut, palm kernel, or palm oil. Seasoning and other foods Salted popcorn and pretzels. Onion salt, garlic salt, seasoned salt, table salt, and sea salt. Worcestershire sauce. Tartar sauce. Barbecue sauce. Teriyaki sauce. Soy sauce, including reduced-sodium. Steak sauce. Canned and packaged gravies. Fish sauce. Oyster sauce. Cocktail sauce. Horseradish that you find on the shelf. Ketchup. Mustard. Meat flavorings and tenderizers. Bouillon cubes. Hot sauce and Tabasco sauce. Premade or packaged marinades. Premade or packaged taco seasonings. Relishes. Regular salad dressings. Where to find more information:  National Heart, Lung, and [place]: https://wilson-eaton.com/  American Heart Association: www.heart.org Summary  The DASH eating plan is a healthy eating plan that has been shown to reduce high blood pressure (hypertension). It may also reduce your risk for type 2 diabetes, heart disease, and stroke.  With the DASH eating plan, you should limit salt (sodium) intake to 2,300 mg a day. If you have hypertension, you may need to reduce your sodium intake to 1,500  mg a day.  When on the DASH eating plan, aim to eat more fresh fruits and vegetables, whole grains, lean proteins, low-fat dairy, and heart-healthy fats.  Work with your health care provider or diet and nutrition specialist (dietitian) to adjust your eating plan to your individual calorie needs. This information is not intended to replace advice given to you by your health care provider. Make sure you discuss any questions you have with your  health care provider. Document Released: 10/11/2011 Document Revised: 10/15/2016 Document Reviewed: 10/15/2016 Elsevier Interactive Patient Education  2017 [employer].

## 2017-06-24 NOTE — Progress Notes (Addendum)
Subjective:    Patient ID: Jermaine Collins, male    DOB: 05-08-1951, 66 y.o.   MRN: [id number]  HPI   66 year old patient who is seen today for a health maintenance exam. He maintains excellent health and has not been seen in some time.  His only concern is some labile.  Home blood pressure readings   Preventive Screening-Counseling & Management  Alcohol-Tobacco  Smoking Status: Discontinued greater than 30 years ago  Allergies (verified):  No Known Drug Allergies   Past History:  Past Surgical History:  Colonoscopy-12/02/2006  Vasectomy  spermatocele repair   Family History:   Family History High cholesterol  Family History Hypertension  Family History of Cardiovascular disorder  Fam hx Stroke  Family History of Aneurysm Aortic (Father)  Family History Lung cancer  Father died 31- CVD History of hypertension and coronary artery disease Mother- Ca Pancreas (14)  1 Brother- good health   Social History:   Married  Former Smoker (d/c 1981-13 yrs)  Alcohol use-yes  Drug use-no  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   Social History   Social History  . Marital status: Married    Spouse name: N/A  . Number of children: N/A  . Years of education: N/A   Occupational History  . Not on file.   Social History Main Topics  . Smoking status: Former Games developer  . Smokeless tobacco: Never Used  . Alcohol use 7.2 oz/week    12 Cans of beer per week  . Drug use: No  . Sexual activity: Not on file   Other Topics Concern  . Not on file   Social History Narrative  . No narrative on file    Past Surgical History:  Procedure Laterality Date  . COLONOSCOPY  12/02/06  . spermatocele repair    . VASECTOMY      Family History  Problem Relation Age of Onset  . Cancer Mother        pancreas  . Aortic aneurysm Father   . Heart disease Father   . Hyperlipidemia Other   . Hypertension Other   . Heart disease  Other   . Stroke Other   . Cancer Other        lung    No Known Allergies  Current Outpatient Prescriptions on File Prior to Visit  Medication Sig Dispense Refill  . ibuprofen (ADVIL,MOTRIN) 200 MG tablet Take 400 mg by mouth as needed.    . Multiple Vitamin (MULTIVITAMIN) tablet Take 1 tablet by mouth daily.     No current facility-administered medications on file prior to visit.     BP (!) 142/70 (BP Location: Left Arm, Patient Position: Sitting, Cuff Size: Normal)   Pulse 69   Temp 97.8 F (36.6 C) (Oral)   Ht 5\' 11"  (1.803 m)   Wt 185 lb 12.8 oz (84.3 kg)   SpO2 98%   BMI 25.91 kg/m    Subsequent Medicare wellness visit  1. Risk factors, based on past  M,S,F history.  Current vascular risk factors include smoking history  2.  Physical activities:remains quite active about the house without exercise limitations.  Does use a treadmill periodically  3.  Depression/mood:no  history of major depression or mood disorder  4.  Hearing:chronic mild tinnitus  5.  ADL's:independent  6.  Fall risk:low  7.  Home safety:no problems identified  8.  Height weight, and visual acuity;height and weight stable no change  in visual acuity  9.  Counseling:continue close home blood pressure monitoring  10. Lab orders based on risk factors:laboratory update will be reviewed  11. Referral :GI referral for follow-up colonoscopy  12. Care plan:continue efforts at aggressive risk factor modification.  We'll place on DASH diet  13. Cognitive assessment: alert and oriented with normal affect.  No cognitive dysfunction 14. Screening: Patient provided with a written and personalized 5-10 year screening schedule in the AVS.    15. Provider List Update: primary care ophthalmology GI  16.  Advanced directives.  Living will and health care power of attorney.  Discussed.  None presently in place.  He will consider     Review of Systems  Constitutional: Negative for activity change,  appetite change, chills, fatigue and fever.  HENT: Negative for congestion, dental problem, ear pain, hearing loss, mouth sores, rhinorrhea, sinus pressure, sneezing, tinnitus, trouble swallowing and voice change.   Eyes: Negative for photophobia, pain, redness and visual disturbance.  Respiratory: Negative for apnea, cough, choking, chest tightness, shortness of breath and wheezing.   Cardiovascular: Negative for chest pain, palpitations and leg swelling.  Gastrointestinal: Negative for abdominal distention, abdominal pain, anal bleeding, blood in stool, constipation, diarrhea, nausea, rectal pain and vomiting.  Genitourinary: Negative for decreased urine volume, difficulty urinating, discharge, dysuria, flank pain, frequency, genital sores, hematuria, penile swelling, scrotal swelling, testicular pain and urgency.       Nocturia times 1  Musculoskeletal: Positive for back pain. Negative for arthralgias, gait problem, joint swelling, myalgias, neck pain and neck stiffness.  Skin: Negative for color change, rash and wound.  Neurological: Negative for dizziness, tremors, seizures, syncope, facial asymmetry, speech difficulty, weakness, light-headedness, numbness and headaches.  Hematological: Negative for adenopathy. Does not bruise/bleed easily.  Psychiatric/Behavioral: Negative for agitation, behavioral problems, confusion, decreased concentration, dysphoric mood, hallucinations, self-injury, sleep disturbance and suicidal ideas. The patient is not nervous/anxious.        Objective:   Physical Exam  Constitutional: He appears well-developed and well-nourished.  HENT:  Head: Normocephalic and atraumatic.  Right Ear: External ear normal.  Left Ear: External ear normal.  Nose: Nose normal.  Mouth/Throat: Oropharynx is clear and moist.  Eyes: Pupils are equal, round, and reactive to light. Conjunctivae and EOM are normal. No scleral icterus.  Neck: Normal range of motion. Neck supple. No JVD  present. No thyromegaly present.  Cardiovascular: Regular rhythm, normal heart sounds and intact distal pulses.  Exam reveals no gallop and no friction rub.   No murmur heard. Pedal pulses full  Pulmonary/Chest: Effort normal and breath sounds normal. He exhibits no tenderness.  Abdominal: Soft. Bowel sounds are normal. He exhibits no distension and no mass. There is no tenderness.  Genitourinary: Prostate normal and penis normal. Rectal exam shows guaiac negative stool.  Genitourinary Comments: Prosthetic enlargement  Musculoskeletal: Normal range of motion. He exhibits no edema or tenderness.  Lymphadenopathy:    He has no cervical adenopathy.  Neurological: He is alert. He has normal reflexes. No cranial nerve deficit. Coordination normal.  Skin: Skin is warm and dry. No rash noted.  Psychiatric: He has a normal mood and affect. His behavior is normal.          Assessment & Plan:   Preventive health examination Subsequent Medicare wellness visit Rule out essential hypertension.  Will continue home blood pressure monitoring.  Will place on a DASH diet.  Follow-up 4 weeks  Preventive health.  Follow-up colonoscopy  Rogelia Boga

## 2017-06-28 ENCOUNTER — Other Ambulatory Visit: Payer: Self-pay | Admitting: Internal Medicine

## 2017-06-28 NOTE — Progress Notes (Signed)
OK will add, thanks, PK

## 2017-07-25 ENCOUNTER — Encounter: Payer: Self-pay | Admitting: Internal Medicine

## 2017-07-26 ENCOUNTER — Ambulatory Visit (INDEPENDENT_AMBULATORY_CARE_PROVIDER_SITE_OTHER): Payer: Medicare Other | Admitting: Internal Medicine

## 2017-07-26 ENCOUNTER — Encounter: Payer: Self-pay | Admitting: Internal Medicine

## 2017-07-26 VITALS — BP 132/80 | HR 78 | Temp 98.4°F | Ht 71.0 in | Wt 191.2 lb

## 2017-07-26 DIAGNOSIS — R030 Elevated blood-pressure reading, without diagnosis of hypertension: Secondary | ICD-10-CM | POA: Diagnosis not present

## 2017-07-26 NOTE — Patient Instructions (Signed)
Limit your sodium (Salt) intake  Please check your blood pressure on a regular basis.  If it is consistently greater than 140/90, please make an office appointment.  Return in one year for follow-up       

## 2017-07-26 NOTE — Progress Notes (Signed)
   Subjective:    Patient ID: Jermaine Collins, male    DOB: 09/24/1951, 66 y.o.   MRN: [id number]  HPI  66 year old patient who is seen recently for a preventive health examination.  Blood pressure was Borderline and he was placed on a DASH diet.  He has continued to monitor closely.  Home blood pressure readings.  They generally run from low normal to high normal.  He generally feels well.  He has moderated his diet as well as his salt consumption.  No past medical history on file.   Social History   Social History  . Marital status: Married    Spouse name: N/A  . Number of children: N/A  . Years of education: N/A   Occupational History  . Not on file.   Social History Main Topics  . Smoking status: Former Games developer  . Smokeless tobacco: Never Used  . Alcohol use 7.2 oz/week    12 Cans of beer per week  . Drug use: No  . Sexual activity: Not on file   Other Topics Concern  . Not on file   Social History Narrative  . No narrative on file    Past Surgical History:  Procedure Laterality Date  . COLONOSCOPY  12/02/06  . spermatocele repair    . VASECTOMY      Family History  Problem Relation Age of Onset  . Cancer Mother        pancreas  . Aortic aneurysm Father   . Heart disease Father   . Hyperlipidemia Other   . Hypertension Other   . Heart disease Other   . Stroke Other   . Cancer Other        lung    No Known Allergies  Current Outpatient Prescriptions on File Prior to Visit  Medication Sig Dispense Refill  . ibuprofen (ADVIL,MOTRIN) 200 MG tablet Take 400 mg by mouth as needed.    . Multiple Vitamin (MULTIVITAMIN) tablet Take 1 tablet by mouth daily.     No current facility-administered medications on file prior to visit.     BP 132/80 (BP Location: Left Arm, Patient Position: Sitting, Cuff Size: Normal)   Pulse 78   Temp 98.4 F (36.9 C) (Oral)   Ht  (1.803 m)   Wt 191 lb 3.2 oz (86.7 kg)   SpO2 98%   BMI 26.67 kg/m     Review of  Systems  Constitutional: Negative.   Respiratory: Negative.   Cardiovascular: Negative.        Objective:   Physical Exam  Constitutional: He appears well-developed and well-nourished. No distress.  Blood pressure arrival 130 over 80 Repeat blood pressure 140/90          Assessment & Plan:   Normotensive.  Will continue home blood pressure monitoring and DASH diet CPX 12 months  Rogelia Boga

## 2017-09-17 ENCOUNTER — Telehealth: Payer: Self-pay | Admitting: Internal Medicine

## 2017-09-17 ENCOUNTER — Ambulatory Visit: Payer: Medicare Other

## 2017-09-17 NOTE — Telephone Encounter (Signed)
Copied from CRM [id number]. Topic: Quick Communication - Appointment Cancellation >> Sep 17, 2017 12:31 PM Stephannie LiSimmons, Kanija Remmel L, NT wrote: Patient called to cancel appointment scheduled for  Patient has not rescheduled their appointment. For  flu shot  , patients wife also cancelled   Route to department's PEC pool.

## 2017-09-17 NOTE — Telephone Encounter (Signed)
Copied from CRM [id number]. Topic: Quick Communication - Appointment Cancellation >> Sep 17, 2017 12:31 PM Stephannie LiSimmons, Hadiyah Maricle L, NT wrote: Patient called to cancel appointment scheduled for  Patient HAS NOt rescheduled their appointment.   Route to department's PEC pool.

## 2017-09-17 NOTE — Telephone Encounter (Signed)
Copied from CRM [phone]. Topic: Quick Communication - Appointment Cancellation >> Sep 17, 2017 12:31 PM Stephannie LiSimmons, Tylerjames Hoglund L, NT wrote: Patient called to cancel appointment scheduled for  Patient has not :rescheduled their appointment. For th flu shot patients wif also cacelled appointment    Route to department's PEC pool.

## 2017-12-20 ENCOUNTER — Ambulatory Visit (INDEPENDENT_AMBULATORY_CARE_PROVIDER_SITE_OTHER): Payer: Medicare Other

## 2017-12-20 DIAGNOSIS — Z23 Encounter for immunization: Secondary | ICD-10-CM | POA: Diagnosis not present

## 2017-12-20 NOTE — Progress Notes (Signed)
Pt had his High dose Flu vaccine this morning, pt tolerated the injection well

## 2018-06-25 ENCOUNTER — Ambulatory Visit (INDEPENDENT_AMBULATORY_CARE_PROVIDER_SITE_OTHER): Payer: Medicare Other | Admitting: Internal Medicine

## 2018-06-25 ENCOUNTER — Encounter: Payer: Self-pay | Admitting: Internal Medicine

## 2018-06-25 VITALS — BP 110/66 | HR 74 | Temp 97.9°F | Ht 71.0 in | Wt 189.4 lb

## 2018-06-25 DIAGNOSIS — Z23 Encounter for immunization: Secondary | ICD-10-CM | POA: Diagnosis not present

## 2018-06-25 DIAGNOSIS — Z0000 Encounter for general adult medical examination without abnormal findings: Secondary | ICD-10-CM

## 2018-06-25 LAB — CBC WITH DIFFERENTIAL/PLATELET
BASOS ABS: 0.1 10*3/uL (ref 0.0–0.1)
BASOS PCT: 0.9 % (ref 0.0–3.0)
Eosinophils Absolute: 0.1 10*3/uL (ref 0.0–0.7)
Eosinophils Relative: 1.3 % (ref 0.0–5.0)
HCT: 45.1 % (ref 39.0–52.0)
Hemoglobin: 15 g/dL (ref 13.0–17.0)
Lymphocytes Relative: 37.6 % (ref 12.0–46.0)
Lymphs Abs: 2.7 10*3/uL (ref 0.7–4.0)
MCHC: 33.3 g/dL (ref 30.0–36.0)
MCV: 91.5 fl (ref 78.0–100.0)
MONOS PCT: 7.7 % (ref 3.0–12.0)
Monocytes Absolute: 0.6 10*3/uL (ref 0.1–1.0)
NEUTROS ABS: 3.8 10*3/uL (ref 1.4–7.7)
Neutrophils Relative %: 52.5 % (ref 43.0–77.0)
PLATELETS: 238 10*3/uL (ref 150.0–400.0)
RBC: 4.93 Mil/uL (ref 4.22–5.81)
RDW: 13.5 % (ref 11.5–15.5)
WBC: 7.3 10*3/uL (ref 4.0–10.5)

## 2018-06-25 LAB — LIPID PANEL
CHOL/HDL RATIO: 3
Cholesterol: 179 mg/dL (ref 0–200)
HDL: 68 mg/dL (ref >39.00)
LDL CALC: 97 mg/dL (ref 0–99)
NonHDL: 110.75
TRIGLYCERIDES: 70 mg/dL (ref 0.0–149.0)
VLDL: 14 mg/dL (ref 0.0–40.0)

## 2018-06-25 LAB — COMPREHENSIVE METABOLIC PANEL
ALT: 24 U/L (ref 0–53)
AST: 24 U/L (ref 0–37)
Albumin: 4.6 g/dL (ref 3.5–5.2)
Alkaline Phosphatase: 63 U/L (ref 39–117)
BILIRUBIN TOTAL: 1.6 mg/dL — AB (ref 0.2–1.2)
BUN: 12 mg/dL (ref 6–23)
CALCIUM: 10.1 mg/dL (ref 8.4–10.5)
CHLORIDE: 102 meq/L (ref 96–112)
CO2: 27 meq/L (ref 19–32)
Creatinine, Ser: 0.97 mg/dL (ref 0.40–1.50)
GFR: 81.68 mL/min (ref >60.00)
Glucose, Bld: 96 mg/dL (ref 70–99)
Potassium: 4.7 mEq/L (ref 3.5–5.1)
Sodium: 138 mEq/L (ref 135–145)
Total Protein: 7.3 g/dL (ref 6.0–8.3)

## 2018-06-25 LAB — TSH: TSH: 1.63 u[IU]/mL (ref 0.35–4.50)

## 2018-06-25 NOTE — Progress Notes (Signed)
Subjective:    Patient ID: Jermaine Collins, male    DOB: July 04, 1951, 67 y.o.   MRN: [id number]  HPI  67 year old patient who is seen today for a annual preventive health examination as well as a subsequent Medicare wellness visit  He enjoys excellent health.  He continues to monitor home blood pressure readings.  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 Risk factors, based on past  M,S,F history.  No significant cardiovascular risk factors.  He does have remote tobacco history as well as some labile blood pressure readings  2.  Physical activities: Remains quite active with household chores and renovations but no formal exercise regimen.  No exercise restrictions  3.  Depression/mood: No history major depression or mood disorder  4.  Hearing: No deficits  5.  ADL's: Independent  6.  Fall risk: Low  7.  Home safety: No issues identified  8.  Height weight, and visual acuity; height and weight stable no change in visual acuity  9.  Counseling: Continue DASH diet and home blood pressure monitoring  10. Lab orders based on risk factors: Laboratory update will be reviewed  11. Referral : None appropriate at this time except for screening colonoscopy 12. Care plan: Continue efforts at aggressive risk factor modification  13. Cognitive assessment: Alert and appropriate normal affect.  No cognitive dysfunction  14. Screening: Patient provided with a written and personalized 5-10 year screening schedule in the AVS.    15. Provider List Update: Primary care  ophthalmology and GI    Review of Systems  Constitutional: Negative for appetite change, chills, fatigue and fever.  HENT: Negative for congestion, dental problem, ear pain, hearing loss, sore throat, tinnitus, trouble swallowing and voice change.   Eyes: Negative for pain, discharge and visual disturbance.  Respiratory: Negative for cough, chest tightness, wheezing and stridor.   Cardiovascular: Negative for chest pain, palpitations and leg swelling.  Gastrointestinal: Negative for abdominal distention, abdominal pain, blood in stool, constipation, diarrhea, nausea and vomiting.  Genitourinary: Negative for difficulty urinating, discharge, flank pain, genital sores, hematuria and urgency.  Musculoskeletal: Negative for arthralgias, back pain, gait problem, joint swelling, myalgias and neck stiffness.  Skin: Negative for rash.  Neurological: Negative for dizziness, syncope, speech difficulty, weakness, numbness and headaches.  Hematological: Negative for adenopathy. Does not bruise/bleed easily.  Psychiatric/Behavioral: Negative for behavioral problems and dysphoric mood. The patient is not nervous/anxious.        Objective:   Physical Exam  Constitutional: He appears well-developed and well-nourished.  HENT:  Head: Normocephalic and atraumatic.  Right Ear: External ear normal.  Left Ear: External ear normal.  Nose: Nose normal.  Mouth/Throat: Oropharynx is clear and moist.  Eyes: Pupils are equal, round, and reactive to light. Conjunctivae and EOM are normal. No scleral icterus.  Neck: Normal range of motion. Neck supple. No JVD present. No thyromegaly present.  Cardiovascular: Regular rhythm, normal heart sounds and intact distal pulses. Exam reveals no gallop and no friction rub.  No murmur heard. Pedal pulses full  Pulmonary/Chest: Effort normal and breath sounds normal. He  exhibits no tenderness.  Abdominal: Soft. Bowel sounds are normal. He exhibits no distension and no mass.  There is no tenderness.  Genitourinary: Penis normal. Rectal exam shows guaiac negative stool.  Genitourinary Comments: Prostate +2 enlarged  Musculoskeletal: Normal range of motion. He exhibits no edema or tenderness.  Lymphadenopathy:    He has no cervical adenopathy.  Neurological: He is alert. He has normal reflexes. No cranial nerve deficit. Coordination normal.  Skin: Skin is warm and dry. No rash noted.  Psychiatric: He has a normal mood and affect. His behavior is normal.          Assessment & Plan:   Preventive health examination History of labile hypertension.  We will continue home blood pressure monitoring as well as DASH diet  Follow-up colonoscopy urged.  He does have contact information  Follow-up in 1 year or as needed  Gordy Saverseter F Rand Boller

## 2018-06-25 NOTE — Patient Instructions (Signed)
Limit your sodium (Salt) intake  Please check your blood pressure on a regular basis.  If it is consistently greater than 150/90, please make an office appointment.  Schedule your colonoscopy to help detect colon cancer.  Return in one year for follow-up  

## 2018-06-26 LAB — HEPATITIS C ANTIBODY
HEP C AB: NONREACTIVE
SIGNAL TO CUT-OFF: 0.01 (ref <1.00)

## 2018-09-10 ENCOUNTER — Ambulatory Visit (INDEPENDENT_AMBULATORY_CARE_PROVIDER_SITE_OTHER): Payer: Medicare Other

## 2018-09-10 DIAGNOSIS — Z23 Encounter for immunization: Secondary | ICD-10-CM

## 2018-10-13 ENCOUNTER — Ambulatory Visit: Payer: Medicare Other | Admitting: Family Medicine

## 2018-10-13 ENCOUNTER — Encounter: Payer: Self-pay | Admitting: Family Medicine

## 2018-10-13 VITALS — BP 120/70 | HR 90 | Temp 98.2°F | Wt 192.2 lb

## 2018-10-13 DIAGNOSIS — Z87891 Personal history of nicotine dependence: Secondary | ICD-10-CM | POA: Diagnosis not present

## 2018-10-13 DIAGNOSIS — Z1211 Encounter for screening for malignant neoplasm of colon: Secondary | ICD-10-CM | POA: Diagnosis not present

## 2018-10-13 DIAGNOSIS — Z8249 Family history of ischemic heart disease and other diseases of the circulatory system: Secondary | ICD-10-CM | POA: Diagnosis not present

## 2018-10-13 MED ORDER — ZOSTER VAC RECOMB ADJUVANTED 50 MCG/0.5ML IM SUSR: 0.5000 mL | Freq: Once | INTRAMUSCULAR | 0 refills | Status: AC

## 2018-10-13 NOTE — Progress Notes (Signed)
Jermaine Collins DOB: 16-Jul-1951 Encounter date: 10/13/2018  This isa 67 y.o. male who presents to establish care. Chief Complaint  Patient presents with  . Transitions Of Care    no new concerns    History of present illness: Uses ibuprofen just as needed for aches or pains. No specific joint pains.   Due for colonoscopy - forgot to schedule last year; knows he needs to do this. Will plan to do in the spring.    History reviewed. No pertinent past medical history. Past Surgical History:  Procedure Laterality Date  . COLONOSCOPY  12/02/06  . spermatocele repair    . VASECTOMY     No Known Allergies Current Meds  Medication Sig  . ibuprofen (ADVIL,MOTRIN) 200 MG tablet Take 400 mg by mouth as needed.  . Multiple Vitamin (MULTIVITAMIN) tablet Take 1 tablet by mouth daily.   Social History   Tobacco Use  . Smoking status: Former Games developermoker  . Smokeless tobacco: Never Used  Substance Use Topics  . Alcohol use: Yes    Alcohol/week: 12.0 standard drinks    Types: 12 Cans of beer per week   Family History  Problem Relation Age of Onset  . Cancer Mother        pancreas  . Aortic aneurysm Father   . Heart disease Father        rheumatic fever as child; valve replacement in 70's  . Stroke Father [id number]  . High blood pressure Brother   . Hyperlipidemia Other   . Hypertension Other   . Heart disease Other   . Stroke Other   . Cancer Other        lung  . Alzheimer's disease Maternal Grandmother   . Heart attack Maternal Grandfather 74  . Heart attack Paternal Grandmother   . Heart attack Paternal Grandfather 5874     Review of Systems  Constitutional: Negative for chills, fatigue and fever.  Respiratory: Negative for cough, chest tightness, shortness of breath and wheezing.   Cardiovascular: Negative for chest pain, palpitations and leg swelling.    Objective:  BP 120/70 (BP Location: Left Arm, Patient Position: Sitting, Cuff Size: Normal)   Pulse 90   Temp 98.2 F (36.8  C) (Oral)   Wt 192 lb 3.2 oz (87.2 kg)   SpO2 97%   BMI 26.81 kg/m   Weight: 192 lb 3.2 oz (87.2 kg)   BP Readings from Last 3 Encounters:  10/13/18 120/70  06/25/18 110/66  07/26/17 132/80   Wt Readings from Last 3 Encounters:  10/13/18 192 lb 3.2 oz (87.2 kg)  06/25/18 189 lb 6.4 oz (85.9 kg)  07/26/17 191 lb 3.2 oz (86.7 kg)    Physical Exam  Constitutional: He is oriented to person, place, and time. He appears well-developed and well-nourished. No distress.  Cardiovascular: Normal rate, regular rhythm and normal heart sounds. Exam reveals no friction rub.  No murmur heard. No lower extremity edema  Pulmonary/Chest: Effort normal and breath sounds normal. No respiratory distress. He has no wheezes. He has no rales.  Abdominal: Soft. Bowel sounds are normal. He exhibits no distension and no mass. There is no tenderness. There is no rebound and no guarding.  Neurological: He is alert and oriented to person, place, and time.  Psychiatric: His behavior is normal. Cognition and memory are normal.    Assessment/Plan: 1. Colon cancer screening  - Ambulatory referral to Gastroenterology  2. Family history of aneurysm Discussed benefits of screening.  - US ABDOMINAL AORTA  SCREENING AAA; Future  3. History of smoking 25-50 pack years  - US ABDOMINAL AORTA SCREENING AAA; Future  Return in about 1 year (around 10/14/2019) for physical exam.  Theodis Shove, MD

## 2018-11-06 ENCOUNTER — Ambulatory Visit
Admission: RE | Admit: 2018-11-06 | Discharge: 2018-11-06 | Disposition: A | Discharge status: Home / Self Care | Payer: Medicare Other | Source: Ambulatory Visit | Attending: Family Medicine | Admitting: Family Medicine

## 2018-11-06 DIAGNOSIS — Z8249 Family history of ischemic heart disease and other diseases of the circulatory system: Secondary | ICD-10-CM

## 2018-11-06 DIAGNOSIS — Z87891 Personal history of nicotine dependence: Secondary | ICD-10-CM

## 2018-11-21 ENCOUNTER — Encounter: Payer: Self-pay | Admitting: Internal Medicine

## 2019-06-18 ENCOUNTER — Encounter: Payer: Self-pay | Admitting: Family Medicine

## 2019-06-18 DIAGNOSIS — Z1211 Encounter for screening for malignant neoplasm of colon: Secondary | ICD-10-CM

## 2019-07-10 ENCOUNTER — Telehealth: Payer: Self-pay | Admitting: *Deleted

## 2019-07-10 ENCOUNTER — Telehealth: Payer: Self-pay | Admitting: Internal Medicine

## 2019-07-10 NOTE — Telephone Encounter (Signed)
I called the pt and he stated he sent a note to Dr Ethlyn Gallery on 8/17 stating he would like to have the Cologuard instead of a colonoscopy.  Order completed with insurance info and faxed to [employer] at [phone].

## 2019-07-10 NOTE — Telephone Encounter (Signed)
Copied from CRM #[id number]. Topic: General - Call Back - No Documentation >> Jul 10, 2019 11:13 AM Eason, Dominique M wrote: Reason for CRM: Pt called to report that he has yet to receive cologuard kit. Please advise, pt says he missed call from office [phone] >> Jul 10, 2019 12:21 PM Overman, Brittany A wrote: Wrong office, sending to [place].  

## 2019-07-14 ENCOUNTER — Telehealth: Payer: Self-pay | Admitting: *Deleted

## 2019-07-14 NOTE — Telephone Encounter (Signed)
Copied from [place] [id number]. Topic: General - Call Back - No Documentation >> Jul 10, 2019 11:13 AM Erick Blinks wrote: Reason for CRM: Pt called to report that he has yet to receive cologuard kit. Please advise, pt says he missed call from office [phone] >> Jul 10, 2019 12:21 PM Para Skeans A wrote: Wrong office, sending to [place].

## 2019-07-14 NOTE — Telephone Encounter (Signed)
See prior note

## 2019-07-23 LAB — COLOGUARD: Cologuard: NEGATIVE

## 2019-07-30 ENCOUNTER — Telehealth: Payer: Self-pay | Admitting: *Deleted

## 2019-07-30 ENCOUNTER — Encounter: Payer: Self-pay | Admitting: Family Medicine

## 2019-07-30 NOTE — Telephone Encounter (Signed)
See abstraction with Cologuard results.

## 2019-07-30 NOTE — Telephone Encounter (Signed)
Copied from CRM #[id number]. Topic: General - Call Back - No Documentation >> Jul 10, 2019 11:13 AM Eason, Dominique M wrote: Reason for CRM: Pt called to report that he has yet to receive cologuard kit. Please advise, pt says he missed call from office [phone] >> Jul 10, 2019 12:21 PM Overman, Brittany A wrote: Wrong office, sending to [place].  

## 2019-08-11 IMAGING — US US ABDOMINAL AORTA SCREENING AAA
1 series · 14 of 25 positions shown · non-contrast
Comparison: None.

CLINICAL DATA: 67-year-old male with smoking history and family
history of AAA.

EXAM:
US ABDOMINAL AORTA MEDICARE SCREENING
TECHNIQUE: Ultrasound examination of the abdominal aorta was performed as a
screening evaluation for abdominal aortic aneurysm.

[Series 1: us abdominal aorta screening aaa · 0.28mm/px · 14 of 30 slices shown]
[im 1/30]
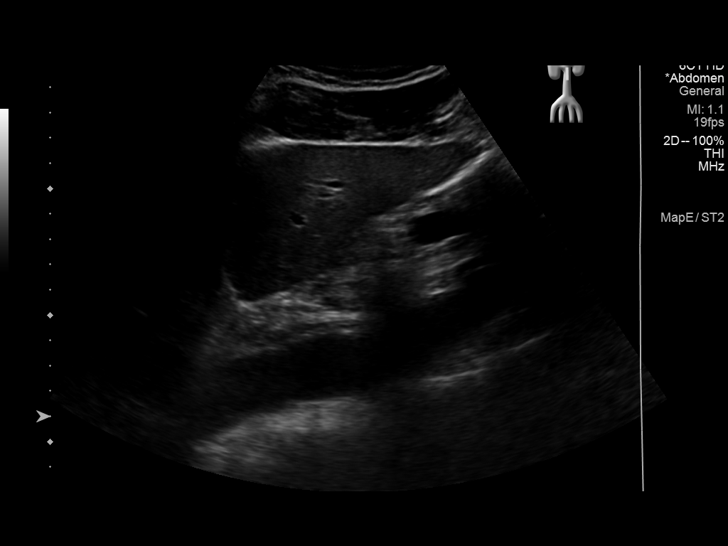
[im 3/30]
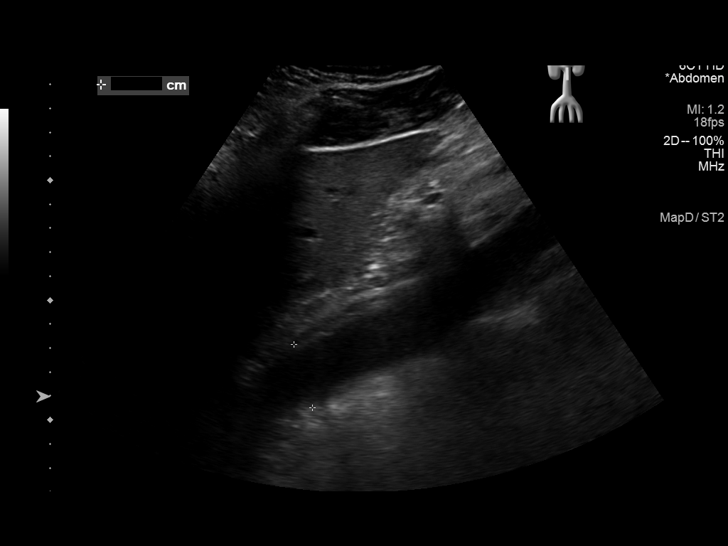
[im 5/30]
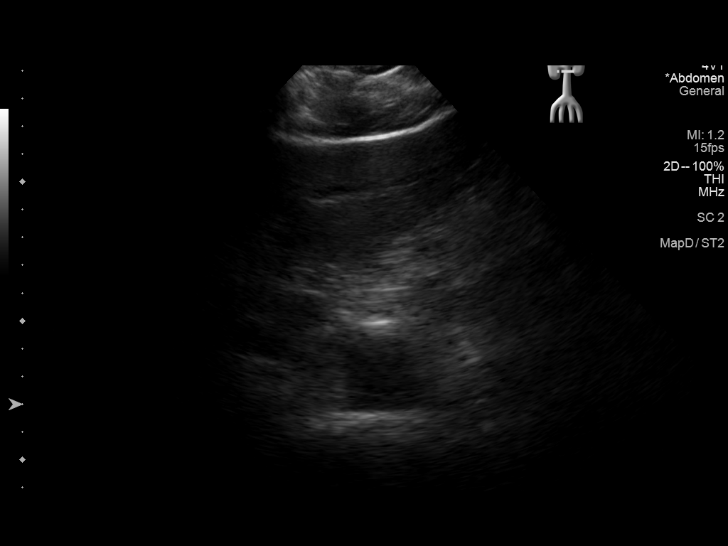
[im 8/30]
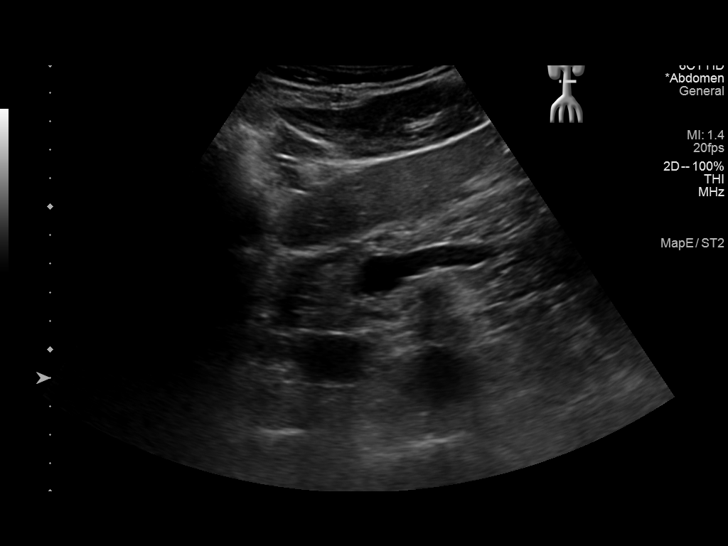
[im 10/30]
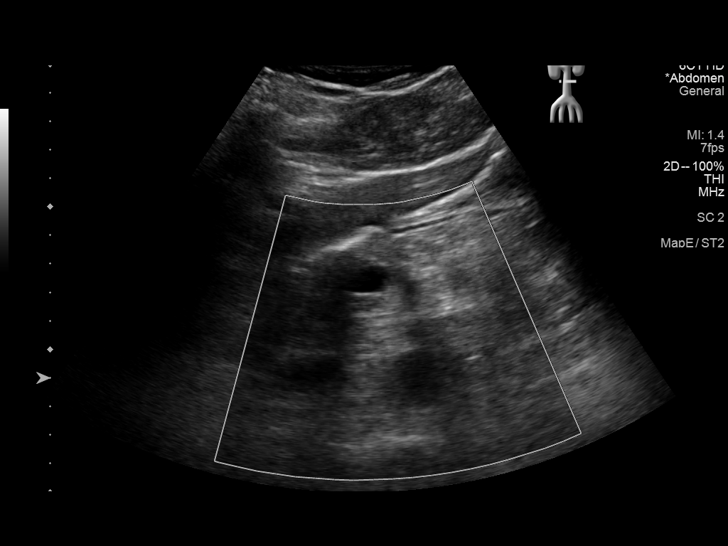
[im 11/30]
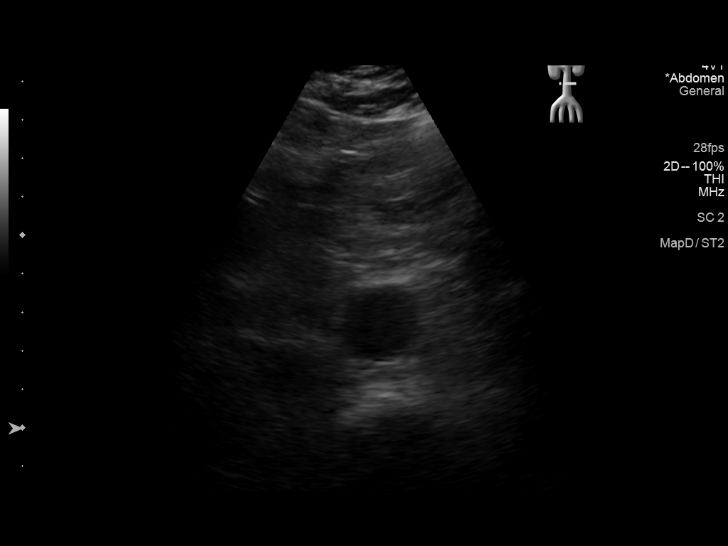
[im 14/30]
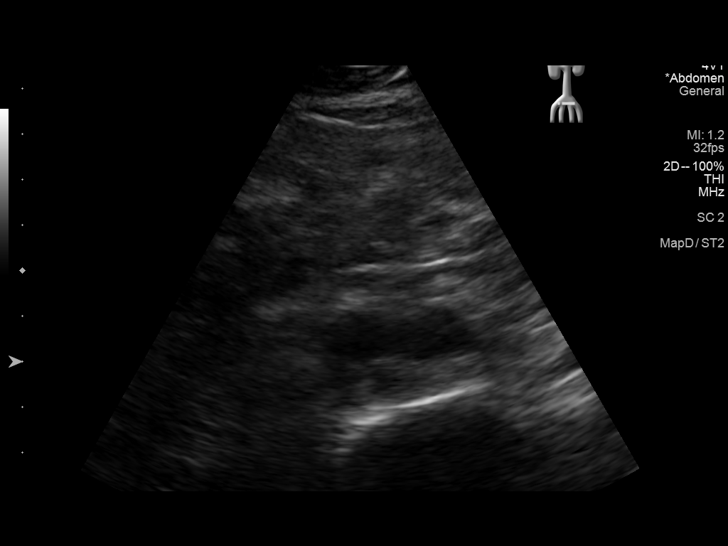
[im 16/30]
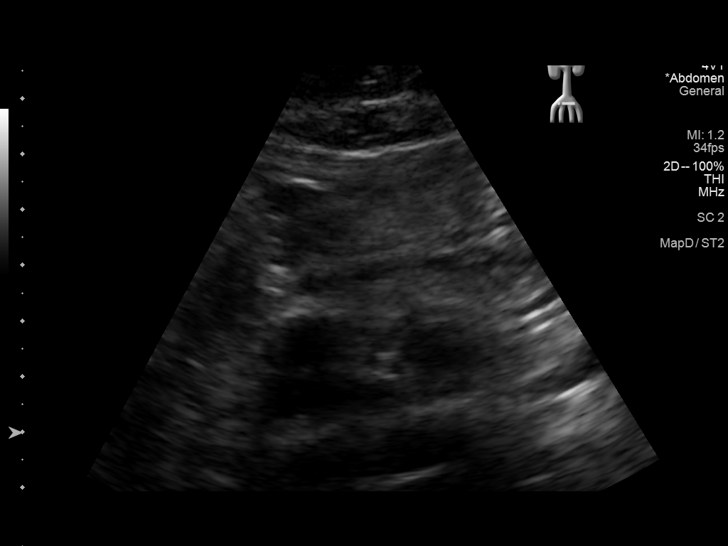
[im 19/30]
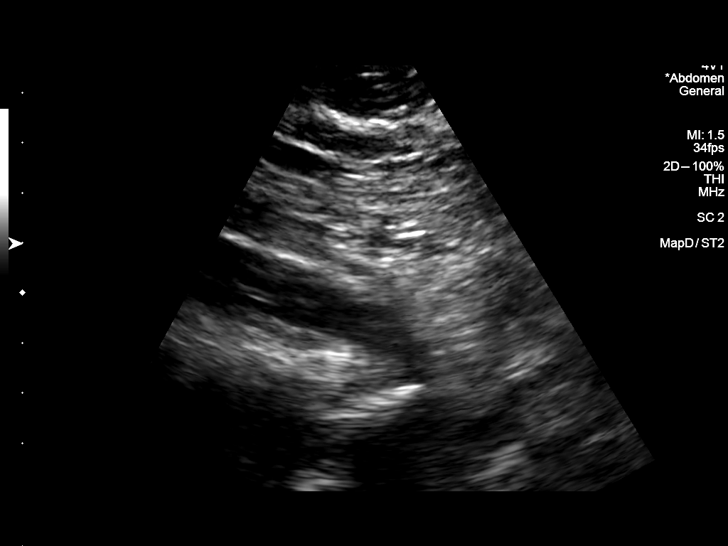
[im 20/30]
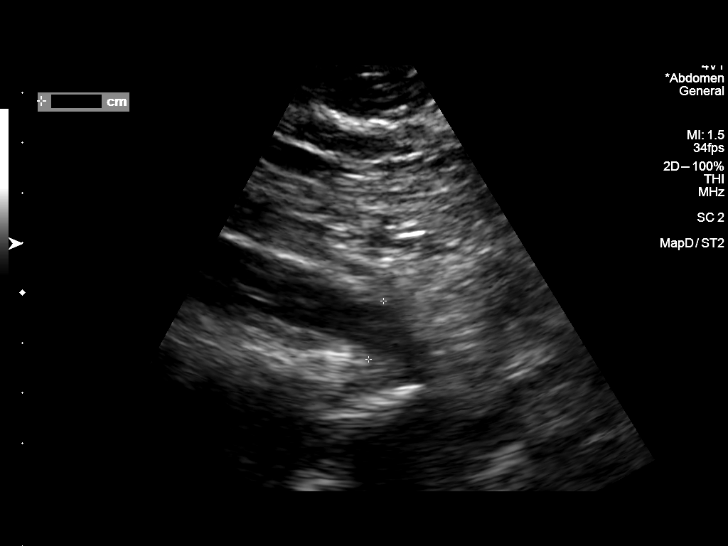
[im 22/30]
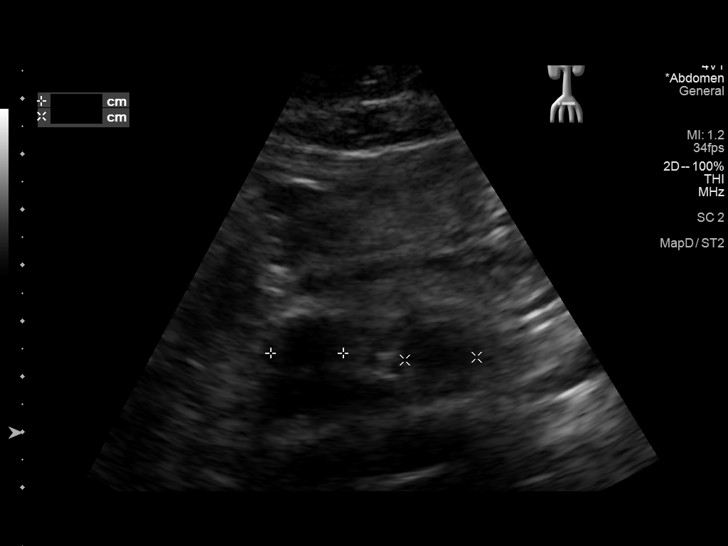
[im 25/30]
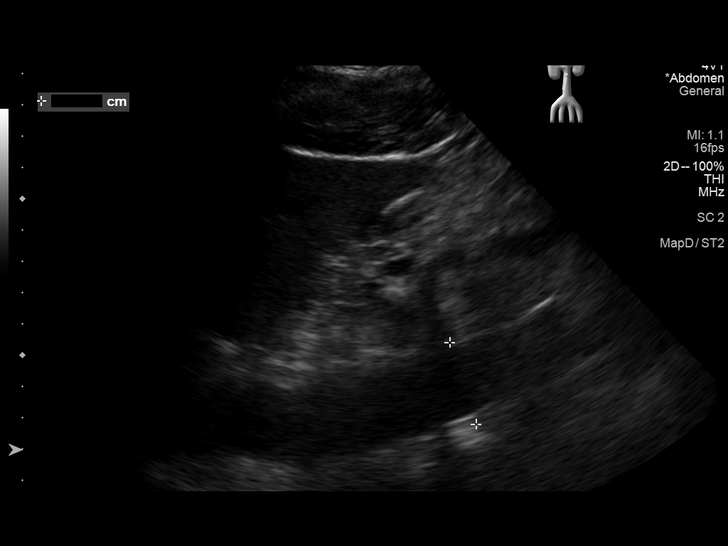
[im 27/30]
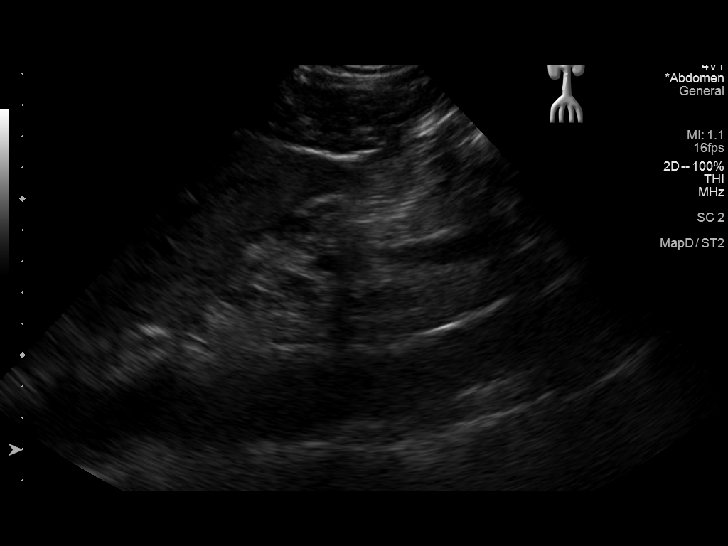
[im 30/30]
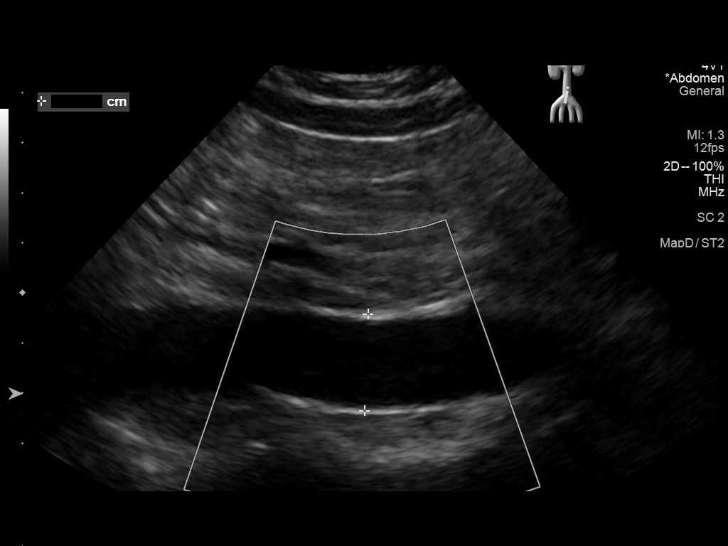

[14 of 25 positions shown; findings below may reference images not displayed]

FINDINGS: Abdominal aortic measurements as follows:

Proximal:  2.8 cm

Mid:  2.8 cm

Distal:  2.0 cm
IMPRESSION: No evidence of abdominal aortic aneurysm.

## 2019-11-17 ENCOUNTER — Telehealth: Payer: Self-pay | Admitting: *Deleted

## 2019-11-17 NOTE — Telephone Encounter (Signed)
-----   Message from Wynn Banker, MD sent at 11/14/2019 12:13 PM EST ----- Was cologuard received? I don't see results; just checking in. Please resend order if not received.  ----- Message ----- From: SYSTEM Sent: 06/27/2019  12:22 AM EST To: Wynn Banker, MD

## 2019-11-17 NOTE — Telephone Encounter (Signed)
Spoke with the pt and he stated he completed the kit and was informed of negative results.  See results that were previously scanned in for 07/23/19.

## 2019-12-31 ENCOUNTER — Ambulatory Visit: Payer: Medicare PPO | Attending: Internal Medicine

## 2019-12-31 DIAGNOSIS — Z23 Encounter for immunization: Secondary | ICD-10-CM | POA: Insufficient documentation

## 2019-12-31 NOTE — Progress Notes (Signed)
   Covid-19 Vaccination Clinic  Name:  Jermaine Collins    MRN: [id number] DOB: 19-Jan-1951  12/31/2019  [name] was observed post Covid-19 immunization for 15 minutes without incidence. He was provided with Vaccine Information Sheet and instruction to access the V-Safe system.   [name] was instructed to call 911 with any severe reactions post vaccine: Marland Kitchen Difficulty breathing  . Swelling of your face and throat  . A fast heartbeat  . A bad rash all over your body  . Dizziness and weakness    Immunizations Administered    Name Date Dose VIS Date Route   Pfizer COVID-19 Vaccine 12/31/2019  1:20 PM 0.3 mL 10/16/2019 Intramuscular   Manufacturer: [employer], [employer]   Lot: [id number]   NDC: [address]-6

## 2020-01-26 ENCOUNTER — Ambulatory Visit: Payer: Medicare PPO | Attending: Internal Medicine

## 2020-01-26 DIAGNOSIS — Z23 Encounter for immunization: Secondary | ICD-10-CM

## 2020-01-26 NOTE — Progress Notes (Signed)
   Covid-19 Vaccination Clinic  Name:  Boykin Baetz    MRN: [id number] DOB: 1950-11-18  01/26/2020  Mr. Betke was observed post Covid-19 immunization for 15 minutes without incident. He was provided with Vaccine Information Sheet and instruction to access the V-Safe system.   Mr. Hashem was instructed to call 911 with any severe reactions post vaccine: Marland Kitchen Difficulty breathing  . Swelling of face and throat  . A fast heartbeat  . A bad rash all over body  . Dizziness and weakness   Immunizations Administered    Name Date Dose VIS Date Route   Pfizer COVID-19 Vaccine 01/26/2020 12:21 PM 0.3 mL 10/16/2019 Intramuscular   Manufacturer: [employer], [employer]   Lot: [id number]   NDC: [address]-4

## 2020-09-06 DIAGNOSIS — D2271 Melanocytic nevi of right lower limb, including hip: Secondary | ICD-10-CM | POA: Diagnosis not present

## 2020-09-06 DIAGNOSIS — L578 Other skin changes due to chronic exposure to nonionizing radiation: Secondary | ICD-10-CM | POA: Diagnosis not present

## 2020-09-06 DIAGNOSIS — L821 Other seborrheic keratosis: Secondary | ICD-10-CM | POA: Diagnosis not present

## 2020-09-06 DIAGNOSIS — Z86018 Personal history of other benign neoplasm: Secondary | ICD-10-CM | POA: Diagnosis not present

## 2020-09-06 DIAGNOSIS — D225 Melanocytic nevi of trunk: Secondary | ICD-10-CM | POA: Diagnosis not present

## 2020-11-15 ENCOUNTER — Ambulatory Visit: Payer: Medicare PPO | Attending: Internal Medicine

## 2020-11-15 ENCOUNTER — Other Ambulatory Visit (HOSPITAL_BASED_OUTPATIENT_CLINIC_OR_DEPARTMENT_OTHER): Payer: Self-pay | Admitting: Internal Medicine

## 2020-11-15 DIAGNOSIS — Z23 Encounter for immunization: Secondary | ICD-10-CM

## 2020-11-15 MED FILL — PFIZER-BIONTECH COVID-19 VA: 30 | 21 days supply | Qty: 0 | Fill #0

## 2020-11-15 NOTE — Progress Notes (Signed)
   Covid-19 Vaccination Clinic  Name:  Genie Mirabal    MRN: [id number] DOB: 05-20-1951  11/15/2020  Mr. Kushner was observed post Covid-19 immunization for 15 minutes without incident. He was provided with Vaccine Information Sheet and instruction to access the V-Safe system.   Mr. Burmester was instructed to call 911 with any severe reactions post vaccine: Marland Kitchen Difficulty breathing  . Swelling of face and throat  . A fast heartbeat  . A bad rash all over body  . Dizziness and weakness   Immunizations Administered    Name Date Dose VIS Date Route   Pfizer COVID-19 Vaccine 11/15/2020 11:25 AM 0.3 mL 08/24/2020 Intramuscular   Manufacturer: [employer], [employer]   Lot: [id number]   NDC: [address]-7

## 2020-12-26 NOTE — Progress Notes (Signed)
Subjective:   Jermaine Collins is a 70 y.o. male who presents for Medicare Annual/Subsequent preventive examination.   I connected with Jermaine Collins today by telephone and verified that I am speaking with the correct person using two identifiers. Location patient: home Location provider: work Persons participating in the virtual visit: patient, provider.   I discussed the limitations, risks, security and privacy concerns of performing an evaluation and management service by telephone and the availability of in person appointments. I also discussed with the patient that there may be a patient responsible charge related to this service. The patient expressed understanding and verbally consented to this telephonic visit.    Interactive audio and video telecommunications were attempted between this provider and patient, however failed, due to patient having technical difficulties OR patient did not have access to video capability.  We continued and completed visit with audio only.     Review of Systems    N/A  Cardiac Risk Factors include: advanced age (>65men, >56 women);male gender     Objective:    Today's Vitals   There is no height or weight on file to calculate BMI.  Advanced Directives 12/27/2020  Does Patient Have a Medical Advance Directive? Yes  Type of Estate agent of [place];Living will  Does patient want to make changes to medical advance directive? No - Patient declined  Copy of Healthcare Power of Attorney in Chart? No - copy requested    Current Medications (verified) Outpatient Encounter Medications as of 12/27/2020  Medication Sig   Multiple Vitamin (MULTIVITAMIN) tablet Take 1 tablet by mouth daily.   ibuprofen (ADVIL,MOTRIN) 200 MG tablet Take 400 mg by mouth as needed.   No facility-administered encounter medications on file as of 12/27/2020.    Allergies (verified) Patient has no known allergies.   History: History reviewed. No  pertinent past medical history. Past Surgical History:  Procedure Laterality Date   COLONOSCOPY  12/02/06   spermatocele repair     VASECTOMY     Family History  Problem Relation Age of Onset   Cancer Mother        pancreas   Aortic aneurysm Father    Heart disease Father        rheumatic fever as child; valve replacement in 70's   Stroke Father 85   High blood pressure Brother    Hyperlipidemia Other    Hypertension Other    Heart disease Other    Stroke Other    Cancer Other        lung   Alzheimer's disease Maternal Grandmother    Heart attack Maternal Grandfather 74   Heart attack Paternal Grandmother    Heart attack Paternal Grandfather 21   Social History   Socioeconomic History   Marital status: Married    Spouse name: Not on file   Number of children: Not on file   Years of education: Not on file   Highest education level: Not on file  Occupational History   Not on file  Tobacco Use   Smoking status: Former Smoker   Smokeless tobacco: Never Used  Substance and Sexual Activity   Alcohol use: Yes    Alcohol/week: 12.0 standard drinks    Types: 12 Cans of beer per week   Drug use: No   Sexual activity: Not on file  Other Topics Concern   Not on file  Social History Narrative   Not on file   Social Determinants of Health   Financial Resource  Strain: Low Risk    Difficulty of Paying Living Expenses: Not hard at all  Food Insecurity: No Food Insecurity   Worried About Programme researcher, broadcasting/film/video in the Last Year: Never true   Ran Out of Food in the Last Year: Never true  Transportation Needs: No Transportation Needs   Lack of Transportation (Medical): No   Lack of Transportation (Non-Medical): No  Physical Activity: Inactive   Days of Exercise per Week: 0 days   Minutes of Exercise per Session: 0 min  Stress: No Stress Concern Present   Feeling of Stress : Not at all  Social Connections: Moderately Isolated   Frequency  of Communication with Friends and Family: More than three times a week   Frequency of Social Gatherings with Friends and Family: Once a week   Attends Religious Services: Never   Database administrator or Organizations: No   Attends Engineer, structural: Never   Marital Status: Married    Tobacco Counseling Counseling given: Not Answered   Clinical Intake:  Pre-visit preparation completed: Yes  Pain : No/denies pain     Nutritional Risks: None Diabetes: No  How often do you need to have someone help you when you read instructions, pamphlets, or other written materials from your doctor or pharmacy?: 1 - Never What is the last grade level you completed in school?: College  Diabetic?No   Interpreter Needed?: No  Information entered by :: [employer]rew[employer],LPN   Activities of Daily Living In your present state of health, do you have any difficulty performing the following activities: 12/27/2020  Hearing? N  Vision? N  Difficulty concentrating or making decisions? N  Walking or climbing stairs? N  Dressing or bathing? N  Doing errands, shopping? N  Preparing Food and eating ? N  Using the Toilet? N  In the past six months, have you accidently leaked urine? N  Do you have problems with loss of bowel control? N  Managing your Medications? N  Managing your Finances? N  Housekeeping or managing your Housekeeping? N  Some recent data might be hidden    Patient Care Team: Wynn Banker, MD as PCP - General (Family Medicine) Elmon Else, MD as Consulting Physician (Dermatology)  Indicate any recent Medical Services you may have received from other than [facility] providers in the past year (date may be approximate).     Assessment:   This is a routine wellness examination for Jermaine Collins.  Hearing/Vision screen  Hearing Screening   125Hz  250Hz  500Hz  1000Hz  2000Hz  3000Hz  4000Hz  6000Hz  8000Hz   Right ear:           Left ear:           Vision Screening Comments: States  gets eyes examined once year. Wears glasses   Dietary issues and exercise activities discussed: Current Exercise Habits: The patient does not participate in regular exercise at present  Goals     Exercise 150 min/wk Moderate Activity      Depression Screen PHQ 2/9 Scores 12/27/2020 06/25/2018 01/26/2014  PHQ - 2 Score 0 0 0    Fall Risk Fall Risk  12/27/2020 06/25/2018 01/26/2014  Falls in the past year? 0 No No  Number falls in past yr: 0 - -  Injury with Fall? 0 - -  Risk for fall due to : No Fall Risks - -  Follow up Falls evaluation completed;Falls prevention discussed - -    FALL RISK PREVENTION PERTAINING TO THE HOME:  Any stairs in  or around the home? Yes  If so, are there any without handrails? No  Home free of loose throw rugs in walkways, pet beds, electrical cords, etc? Yes  Adequate lighting in your home to reduce risk of falls? Yes   ASSISTIVE DEVICES UTILIZED TO PREVENT FALLS:  Life alert? No  Use of a cane, walker or w/c? No  Grab bars in the bathroom? No  Shower chair or bench in shower? No  Elevated toilet seat or a handicapped toilet? No    Cognitive Function: Normal cognitive status assessed by direct observation by this Nurse Health Advisor. No abnormalities found.          Immunizations Immunization History  Administered Date(s) Administered   Influenza Split 08/05/2013   Influenza, High Dose Seasonal PF 12/20/2017, 09/10/2018   PFIZER(Purple Top)SARS-COV-2 Vaccination 12/31/2019, 01/26/2020, 11/15/2020   Pneumococcal Conjugate-13 06/25/2018   Tdap 01/26/2014   Zoster 06/07/2011    TDAP status: Up to date  Flu Vaccine status: Due, Education has been provided regarding the importance of this vaccine. Advised may receive this vaccine at local pharmacy or Health Dept. Aware to provide a copy of the vaccination record if obtained from local pharmacy or Health Dept. Verbalized acceptance and understanding.  Pneumococcal vaccine status: Due,  Education has been provided regarding the importance of this vaccine. Advised may receive this vaccine at local pharmacy or Health Dept. Aware to provide a copy of the vaccination record if obtained from local pharmacy or Health Dept. Verbalized acceptance and understanding.  Covid-19 vaccine status: Completed vaccines  Qualifies for Shingles Vaccine? Yes   Zostavax completed Yes   Shingrix Completed?: No.    Education has been provided regarding the importance of this vaccine. Patient has been advised to call insurance company to determine out of pocket expense if they have not yet received this vaccine. Advised may also receive vaccine at local pharmacy or Health Dept. Verbalized acceptance and understanding.  Screening Tests Health Maintenance  Topic Date Due   PNA vac Low Risk Adult (2 of 2 - PPSV23) 06/26/2019   INFLUENZA VACCINE  06/05/2020   Fecal DNA (Cologuard)  07/22/2022   TETANUS/TDAP  01/27/2024   COVID-19 Vaccine  Completed   Hepatitis C Screening  Completed    Health Maintenance  Health Maintenance Due  Topic Date Due   PNA vac Low Risk Adult (2 of 2 - PPSV23) 06/26/2019   INFLUENZA VACCINE  06/05/2020    Colorectal cancer screening: Type of screening: Cologuard. Completed 07/23/2019. Repeat every 3 years  Lung Cancer Screening: (Low Dose CT Chest recommended if Age 64-80 years, 30 pack-year currently smoking OR have quit w/in 15years.) does not qualify.   Lung Cancer Screening Referral: N/A   Additional Screening:  Hepatitis C Screening: does qualify; Completed 06/25/2018  Vision Screening: Recommended annual ophthalmology exams for early detection of glaucoma and other disorders of the eye. Is the patient up to date with their annual eye exam?  Yes  Who is the provider or what is the name of the office in which the patient attends annual eye exams? Dr.Dunn If pt is not established with a provider, would they like to be referred to a provider to  establish care? No .   Dental Screening: Recommended annual dental exams for proper oral hygiene  Community Resource Referral / Chronic Care Management: CRR required this visit?  No   CCM required this visit?  No      Plan:     I have personally  reviewed and noted the following in the patients chart:    Medical and social history  Use of alcohol, tobacco or illicit drugs   Current medications and supplements  Functional ability and status  Nutritional status  Physical activity  Advanced directives  List of other physicians  Hospitalizations, surgeries, and ER visits in previous 12 months  Vitals  Screenings to include cognitive, depression, and falls  Referrals and appointments  In addition, I have reviewed and discussed with patient certain preventive protocols, quality metrics, and best practice recommendations. A written personalized care plan for preventive services as well as general preventive health recommendations were provided to patient.     Theodora Blow, LPN   [id number]   Nurse Notes: None

## 2020-12-27 ENCOUNTER — Ambulatory Visit (INDEPENDENT_AMBULATORY_CARE_PROVIDER_SITE_OTHER): Payer: Medicare PPO

## 2020-12-27 ENCOUNTER — Other Ambulatory Visit: Payer: Self-pay

## 2020-12-27 DIAGNOSIS — Z0000 Encounter for general adult medical examination without abnormal findings: Secondary | ICD-10-CM | POA: Diagnosis not present

## 2020-12-27 NOTE — Patient Instructions (Signed)
Jermaine Collins , Thank you for taking time to come for your Medicare Wellness Visit. I appreciate your ongoing commitment to your health goals. Please review the following plan we discussed and let me know if I can assist you in the future.   Screening recommendations/referrals: Colonoscopy: Cologuard up to date, next due 07/22/2022 Recommended yearly ophthalmology/optometry visit for glaucoma screening and checkup Recommended yearly dental visit for hygiene and checkup  Vaccinations: Influenza vaccine: Currently due if you would like to receive you may do so at your pharmacy or at our office  Pneumococcal vaccine: Currently due for Pneum 23, you may receive this at your next in person office visit  Tdap vaccine: Up to date, next due 01/27/2024 Shingles vaccine: up to date, however if you would like to receive the newer Shingles vaccine shingrix you may do so. We usually recommend that you receive this at your pharmacy as it is less expensive     Advanced directives: Please bring copies of your advanced medical directives so that we may scan it into your chart.   Conditions/risks identified: None   Next appointment: 01/13/2021 @ 11:30 am with Dr. Hassan Rowan   Preventive Care 68 Years and Older, Male Preventive care refers to lifestyle choices and visits with your health care provider that can promote health and wellness. What does preventive care include?  A yearly physical exam. This is also called an annual well check.  Dental exams once or twice a year.  Routine eye exams. Ask your health care provider how often you should have your eyes checked.  Personal lifestyle choices, including:  Daily care of your teeth and gums.  Regular physical activity.  Eating a healthy diet.  Avoiding tobacco and drug use.  Limiting alcohol use.  Practicing safe sex.  Taking low doses of aspirin every day.  Taking vitamin and mineral supplements as recommended by your health care  provider. What happens during an annual well check? The services and screenings done by your health care provider during your annual well check will depend on your age, overall health, lifestyle risk factors, and family history of disease. Counseling  Your health care provider may ask you questions about your:  Alcohol use.  Tobacco use.  Drug use.  Emotional well-being.  Home and relationship well-being.  Sexual activity.  Eating habits.  History of falls.  Memory and ability to understand (cognition).  Work and work Astronomer. Screening  You may have the following tests or measurements:  Height, weight, and BMI.  Blood pressure.  Lipid and cholesterol levels. These may be checked every 5 years, or more frequently if you are over 27 years old.  Skin check.  Lung cancer screening. You may have this screening every year starting at age 20 if you have a 30-pack-year history of smoking and currently smoke or have quit within the past 15 years.  Fecal occult blood test (FOBT) of the stool. You may have this test every year starting at age 54.  Flexible sigmoidoscopy or colonoscopy. You may have a sigmoidoscopy every 5 years or a colonoscopy every 10 years starting at age 57.  Prostate cancer screening. Recommendations will vary depending on your family history and other risks.  Hepatitis C blood test.  Hepatitis B blood test.  Sexually transmitted disease (STD) testing.  Diabetes screening. This is done by checking your blood sugar (glucose) after you have not eaten for a while (fasting). You may have this done every 1-3 years.  Abdominal aortic aneurysm (AAA)  screening. You may need this if you are a current or former smoker.  Osteoporosis. You may be screened starting at age 32 if you are at high risk. Talk with your health care provider about your test results, treatment options, and if necessary, the need for more tests. Vaccines  Your health care provider  may recommend certain vaccines, such as:  Influenza vaccine. This is recommended every year.  Tetanus, diphtheria, and acellular pertussis (Tdap, Td) vaccine. You may need a Td booster every 10 years.  Zoster vaccine. You may need this after age 55.  Pneumococcal 13-valent conjugate (PCV13) vaccine. One dose is recommended after age 13.  Pneumococcal polysaccharide (PPSV23) vaccine. One dose is recommended after age 54. Talk to your health care provider about which screenings and vaccines you need and how often you need them. This information is not intended to replace advice given to you by your health care provider. Make sure you discuss any questions you have with your health care provider. Document Released: 11/18/2015 Document Revised: 07/11/2016 Document Reviewed: 08/23/2015 Elsevier Interactive Patient Education  2017 [place] Prevention in the Home Falls can cause injuries. They can happen to people of all ages. There are many things you can do to make your home safe and to help prevent falls. What can I do on the outside of my home?  Regularly fix the edges of walkways and driveways and fix any cracks.  Remove anything that might make you trip as you walk through a door, such as a raised step or threshold.  Trim any bushes or trees on the path to your home.  Use bright outdoor lighting.  Clear any walking paths of anything that might make someone trip, such as rocks or tools.  Regularly check to see if handrails are loose or broken. Make sure that both sides of any steps have handrails.  Any raised decks and porches should have guardrails on the edges.  Have any leaves, snow, or ice cleared regularly.  Use sand or salt on walking paths during winter.  Clean up any spills in your garage right away. This includes oil or grease spills. What can I do in the bathroom?  Use night lights.  Install grab bars by the toilet and in the tub and shower. Do not use  towel bars as grab bars.  Use non-skid mats or decals in the tub or shower.  If you need to sit down in the shower, use a plastic, non-slip stool.  Keep the floor dry. Clean up any water that spills on the floor as soon as it happens.  Remove soap buildup in the tub or shower regularly.  Attach bath mats securely with double-sided non-slip rug tape.  Do not have throw rugs and other things on the floor that can make you trip. What can I do in the bedroom?  Use night lights.  Make sure that you have a light by your bed that is easy to reach.  Do not use any sheets or blankets that are too big for your bed. They should not hang down onto the floor.  Have a firm chair that has side arms. You can use this for support while you get dressed.  Do not have throw rugs and other things on the floor that can make you trip. What can I do in the kitchen?  Clean up any spills right away.  Avoid walking on wet floors.  Keep items that you use a lot in easy-to-reach  places.  If you need to reach something above you, use a strong step stool that has a grab bar.  Keep electrical cords out of the way.  Do not use floor polish or wax that makes floors slippery. If you must use wax, use non-skid floor wax.  Do not have throw rugs and other things on the floor that can make you trip. What can I do with my stairs?  Do not leave any items on the stairs.  Make sure that there are handrails on both sides of the stairs and use them. Fix handrails that are broken or loose. Make sure that handrails are as long as the stairways.  Check any carpeting to make sure that it is firmly attached to the stairs. Fix any carpet that is loose or worn.  Avoid having throw rugs at the top or bottom of the stairs. If you do have throw rugs, attach them to the floor with carpet tape.  Make sure that you have a light switch at the top of the stairs and the bottom of the stairs. If you do not have them, ask  someone to add them for you. What else can I do to help prevent falls?  Wear shoes that:  Do not have high heels.  Have rubber bottoms.  Are comfortable and fit you well.  Are closed at the toe. Do not wear sandals.  If you use a stepladder:  Make sure that it is fully opened. Do not climb a closed stepladder.  Make sure that both sides of the stepladder are locked into place.  Ask someone to hold it for you, if possible.  Clearly mark and make sure that you can see:  Any grab bars or handrails.  First and last steps.  Where the edge of each step is.  Use tools that help you move around (mobility aids) if they are needed. These include:  Canes.  Walkers.  Scooters.  Crutches.  Turn on the lights when you go into a dark area. Replace any light bulbs as soon as they burn out.  Set up your furniture so you have a clear path. Avoid moving your furniture around.  If any of your floors are uneven, fix them.  If there are any pets around you, be aware of where they are.  Review your medicines with your doctor. Some medicines can make you feel dizzy. This can increase your chance of falling. Ask your doctor what other things that you can do to help prevent falls. This information is not intended to replace advice given to you by your health care provider. Make sure you discuss any questions you have with your health care provider. Document Released: 08/18/2009 Document Revised: 03/29/2016 Document Reviewed: 11/26/2014 Elsevier Interactive Patient Education  2017 [employer].

## 2021-01-11 ENCOUNTER — Telehealth: Payer: Self-pay | Admitting: *Deleted

## 2021-01-11 NOTE — Telephone Encounter (Signed)
PCP requested the pt to change his virtual visit on 3/11 to 12pm due to scheduling change.  I left a message for the pt to return my call.

## 2021-01-11 NOTE — Telephone Encounter (Signed)
Patient called back and agreed to change the appt to 12pm.  Message sent to Brittney to enter the new time slot.

## 2021-01-13 ENCOUNTER — Encounter: Payer: Self-pay | Admitting: Family Medicine

## 2021-01-13 ENCOUNTER — Telehealth (INDEPENDENT_AMBULATORY_CARE_PROVIDER_SITE_OTHER): Payer: Medicare PPO | Admitting: Family Medicine

## 2021-01-13 ENCOUNTER — Telehealth: Payer: Medicare PPO | Admitting: Family Medicine

## 2021-01-13 VITALS — BP 136/95 | Wt 187.0 lb

## 2021-01-13 DIAGNOSIS — Z13220 Encounter for screening for lipoid disorders: Secondary | ICD-10-CM

## 2021-01-13 DIAGNOSIS — R030 Elevated blood-pressure reading, without diagnosis of hypertension: Secondary | ICD-10-CM | POA: Diagnosis not present

## 2021-01-13 DIAGNOSIS — Z23 Encounter for immunization: Secondary | ICD-10-CM

## 2021-01-13 NOTE — Progress Notes (Signed)
Virtual Visit via Video Note  I connected with Jermaine Collins  on 01/13/21 at 12:00 PM EST by a video enabled telemedicine application and verified that I am speaking with the correct person using two identifiers.  Location patient: home Location provider: [facility]  [address] [place], [place] [address] Persons participating in the virtual visit: patient, provider  I discussed the limitations of evaluation and management by telemedicine and the availability of in person appointments. The patient expressed understanding and agreed to proceed.   Jermaine Collins DOB: 05/19/1951 Encounter date: 01/13/2021  This is a 70 y.o. male who presents with Chief Complaint  Patient presents with  . Follow-up    History of present illness: Last visit with me was 10/2018 for transition of care.  He did complete a Cologuard in 07/2019 that was negative.  Last blood work was in August 2019.  No specific worries today.   BP today 11:30 was 136/95, HR 81. Temp 98.2 Last time he was checking regularly was about a year ago. At one time even Dr. Kirtland Bouchard was worried because it was staying elevated. 130/84, 128/88, 122/79. He does eat lower sodium diet; wife also on low salt diet. Exercise: not as much over winter. In spring he will start more outdoor yard work and keeping up with puppy.   No headaches, chest pain, chest pressure.  Last eye exam was 1.5 years ago.   He did do sleep study in past with Dr. Kirtland Bouchard; was mild sleep apnea. Offered palate surgery but he did not want to have this with mild symptoms he was experiencing   No Known Allergies Current Meds  Medication Sig  . ibuprofen (ADVIL,MOTRIN) 200 MG tablet Take 400 mg by mouth as needed.  . Multiple Vitamin (MULTIVITAMIN) tablet Take 1 tablet by mouth daily.    Review of Systems  Constitutional: Negative for chills, fatigue and fever.  Respiratory: Negative for cough, chest tightness, shortness of breath and wheezing.    Cardiovascular: Negative for chest pain, palpitations and leg swelling.    Objective:  Wt 187 lb (84.8 kg)   BMI 26.08 kg/m   Weight: 187 lb (84.8 kg)   BP Readings from Last 3 Encounters:  10/13/18 120/70  06/25/18 110/66  07/26/17 132/80   Wt Readings from Last 3 Encounters:  01/13/21 187 lb (84.8 kg)  10/13/18 192 lb 3.2 oz (87.2 kg)  06/25/18 189 lb 6.4 oz (85.9 kg)    EXAM:  GENERAL: alert, oriented, appears well and in no acute distress  HEENT: atraumatic, conjunctiva clear, no obvious abnormalities on inspection of external nose and ears  NECK: normal movements of the head and neck  LUNGS: on inspection no signs of respiratory distress, breathing rate appears normal, no obvious gross SOB, gasping or wheezing  CV: no obvious cyanosis  MS: moves all visible extremities without noticeable abnormality  PSYCH/NEURO: pleasant and cooperative, no obvious depression or anxiety, speech and thought processing grossly intact   Assessment/Plan  1. Elevated blood pressure reading He will check at home regularly and let me know numbers in 1-2 weeks time. We discussed goals with blood pressure and we will obtain bloodwork soon to make sure no concerns there. - CBC with Differential/Platelet; Future - Comprehensive metabolic panel; Future - TSH; Future  2. Lipid screening - Lipid panel; Future  3. Need for pneumococcal vaccination Offered nurse visit for this if desired. If not, we can get this completed at next in office visit.  I discussed the assessment and treatment plan with the patient. The patient was provided an opportunity to ask questions and all were answered. The patient agreed with the plan and demonstrated an understanding of the instructions.   The patient was advised to call back or seek an in-person evaluation if the symptoms worsen or if the condition fails to improve as anticipated.  I provided 15 minutes of non-face-to-face time during this  encounter.   Theodis Shove, MD

## 2021-01-17 ENCOUNTER — Telehealth: Payer: Self-pay | Admitting: *Deleted

## 2021-01-17 NOTE — Telephone Encounter (Signed)
-----   Message from Wynn Banker, MD sent at 01/13/2021 12:42 PM EST ----- Please set up lab appointment. We discussed pneumonia vaccine - offer nurse visit for pneumo 23 if interested.

## 2021-01-17 NOTE — Telephone Encounter (Signed)
Patient informed of the message below,  stated he will call back for a lab appt and will have the pneumonia vaccine during next visit with PCP.

## 2021-01-20 ENCOUNTER — Other Ambulatory Visit: Payer: Self-pay

## 2021-01-20 ENCOUNTER — Other Ambulatory Visit (INDEPENDENT_AMBULATORY_CARE_PROVIDER_SITE_OTHER): Payer: Medicare PPO

## 2021-01-20 DIAGNOSIS — Z13220 Encounter for screening for lipoid disorders: Secondary | ICD-10-CM | POA: Diagnosis not present

## 2021-01-20 DIAGNOSIS — R030 Elevated blood-pressure reading, without diagnosis of hypertension: Secondary | ICD-10-CM

## 2021-01-20 LAB — CBC WITH DIFFERENTIAL/PLATELET
Basophils Absolute: 0.1 10*3/uL (ref 0.0–0.1)
Basophils Relative: 1.1 % (ref 0.0–3.0)
Eosinophils Absolute: 0.2 10*3/uL (ref 0.0–0.7)
Eosinophils Relative: 2.5 % (ref 0.0–5.0)
HCT: 43 % (ref 39.0–52.0)
Hemoglobin: 14.6 g/dL (ref 13.0–17.0)
Lymphocytes Relative: 39.5 % (ref 12.0–46.0)
Lymphs Abs: 2.7 10*3/uL (ref 0.7–4.0)
MCHC: 33.9 g/dL (ref 30.0–36.0)
MCV: 93 fl (ref 78.0–100.0)
Monocytes Absolute: 0.7 10*3/uL (ref 0.1–1.0)
Monocytes Relative: 10.3 % (ref 3.0–12.0)
Neutro Abs: 3.2 10*3/uL (ref 1.4–7.7)
Neutrophils Relative %: 46.6 % (ref 43.0–77.0)
Platelets: 201 10*3/uL (ref 150.0–400.0)
RBC: 4.62 Mil/uL (ref 4.22–5.81)
RDW: 13.5 % (ref 11.5–15.5)
WBC: 6.9 10*3/uL (ref 4.0–10.5)

## 2021-01-20 LAB — COMPREHENSIVE METABOLIC PANEL
ALT: 27 U/L (ref 0–53)
AST: 26 U/L (ref 0–37)
Albumin: 4.2 g/dL (ref 3.5–5.2)
Alkaline Phosphatase: 63 U/L (ref 39–117)
BUN: 10 mg/dL (ref 6–23)
CO2: 27 mEq/L (ref 19–32)
Calcium: 9.4 mg/dL (ref 8.4–10.5)
Chloride: 105 mEq/L (ref 96–112)
Creatinine, Ser: 0.83 mg/dL (ref 0.40–1.50)
GFR: 88.32 mL/min (ref >60.00)
Glucose, Bld: 91 mg/dL (ref 70–99)
Potassium: 4.2 mEq/L (ref 3.5–5.1)
Sodium: 140 mEq/L (ref 135–145)
Total Bilirubin: 1.4 mg/dL — ABNORMAL HIGH (ref 0.2–1.2)
Total Protein: 6.8 g/dL (ref 6.0–8.3)

## 2021-01-20 LAB — LIPID PANEL
Cholesterol: 189 mg/dL (ref 0–200)
HDL: 73.5 mg/dL (ref >39.00)
LDL Cholesterol: 99 mg/dL (ref 0–99)
NonHDL: 115.27
Total CHOL/HDL Ratio: 3
Triglycerides: 83 mg/dL (ref 0.0–149.0)
VLDL: 16.6 mg/dL (ref 0.0–40.0)

## 2021-01-20 LAB — TSH: TSH: 2.73 u[IU]/mL (ref 0.35–4.50)

## 2021-04-24 DIAGNOSIS — H353131 Nonexudative age-related macular degeneration, bilateral, early dry stage: Secondary | ICD-10-CM | POA: Diagnosis not present

## 2021-04-24 DIAGNOSIS — H40013 Open angle with borderline findings, low risk, bilateral: Secondary | ICD-10-CM | POA: Diagnosis not present

## 2021-05-05 DIAGNOSIS — H40013 Open angle with borderline findings, low risk, bilateral: Secondary | ICD-10-CM | POA: Diagnosis not present

## 2021-11-24 ENCOUNTER — Telehealth: Payer: Self-pay | Admitting: Family Medicine

## 2021-11-24 NOTE — Telephone Encounter (Signed)
Spoke with patient to schedule Medicare Annual Wellness Visit (AWV) either virtually or in office.   He stated he would have to call me back after he gets home to look at calendar  Last AWV 12/27/20 ; please schedule at anytime with [facility] Nurse Health Advisor 1 or 2   This should be a 45 minute visit.

## 2021-12-18 ENCOUNTER — Ambulatory Visit (INDEPENDENT_AMBULATORY_CARE_PROVIDER_SITE_OTHER): Payer: Medicare PPO

## 2021-12-18 VITALS — Ht 72.0 in | Wt 188.0 lb

## 2021-12-18 DIAGNOSIS — Z0000 Encounter for general adult medical examination without abnormal findings: Secondary | ICD-10-CM

## 2021-12-18 NOTE — Patient Instructions (Signed)
Jermaine Collins , Thank you for taking time to come for your Medicare Wellness Visit. I appreciate your ongoing commitment to your health goals. Please review the following plan we discussed and let me know if I can assist you in the future.   Screening recommendations/referrals: Colonoscopy: cologuard 07/23/2019, due 07/22/2022 Recommended yearly ophthalmology/optometry visit for glaucoma screening and checkup Recommended yearly dental visit for hygiene and checkup  Vaccinations: Influenza vaccine: decline this year Pneumococcal vaccine: due now Tdap vaccine: completed 01/26/2014, due 01/27/2024 Shingles vaccine: discussed   Covid-19:  11/15/2020, 01/26/2020, 12/31/2019  Advanced directives: Please bring a copy of your POA (Power of Attorney) and/or Living Will to your next appointment.   Conditions/risks identified: none  Next appointment: Follow up in one year for your annual wellness visit.   Preventive Care 72 Years and Older, Male Preventive care refers to lifestyle choices and visits with your health care provider that can promote health and wellness. What does preventive care include? A yearly physical exam. This is also called an annual well check. Dental exams once or twice a year. Routine eye exams. Ask your health care provider how often you should have your eyes checked. Personal lifestyle choices, including: Daily care of your teeth and gums. Regular physical activity. Eating a healthy diet. Avoiding tobacco and drug use. Limiting alcohol use. Practicing safe sex. Taking low doses of aspirin every day. Taking vitamin and mineral supplements as recommended by your health care provider. What happens during an annual well check? The services and screenings done by your health care provider during your annual well check will depend on your age, overall health, lifestyle risk factors, and family history of disease. Counseling  Your health care provider may ask you questions about  your: Alcohol use. Tobacco use. Drug use. Emotional well-being. Home and relationship well-being. Sexual activity. Eating habits. History of falls. Memory and ability to understand (cognition). Work and work Astronomer. Screening  You may have the following tests or measurements: Height, weight, and BMI. Blood pressure. Lipid and cholesterol levels. These may be checked every 5 years, or more frequently if you are over 72 years old. Skin check. Lung cancer screening. You may have this screening every year starting at age 49 if you have a 30-pack-year history of smoking and currently smoke or have quit within the past 15 years. Fecal occult blood test (FOBT) of the stool. You may have this test every year starting at age 30. Flexible sigmoidoscopy or colonoscopy. You may have a sigmoidoscopy every 5 years or a colonoscopy every 10 years starting at age 49. Prostate cancer screening. Recommendations will vary depending on your family history and other risks. Hepatitis C blood test. Hepatitis B blood test. Sexually transmitted disease (STD) testing. Diabetes screening. This is done by checking your blood sugar (glucose) after you have not eaten for a while (fasting). You may have this done every 1-3 years. Abdominal aortic aneurysm (AAA) screening. You may need this if you are a current or former smoker. Osteoporosis. You may be screened starting at age 72 if you are at high risk. Talk with your health care provider about your test results, treatment options, and if necessary, the need for more tests. Vaccines  Your health care provider may recommend certain vaccines, such as: Influenza vaccine. This is recommended every year. Tetanus, diphtheria, and acellular pertussis (Tdap, Td) vaccine. You may need a Td booster every 10 years. Zoster vaccine. You may need this after age 72. Pneumococcal 13-valent conjugate (PCV13) vaccine. One  dose is recommended after age 72. Pneumococcal  polysaccharide (PPSV23) vaccine. One dose is recommended after age 72. Talk to your health care provider about which screenings and vaccines you need and how often you need them. This information is not intended to replace advice given to you by your health care provider. Make sure you discuss any questions you have with your health care provider. Document Released: 11/18/2015 Document Revised: 07/11/2016 Document Reviewed: 08/23/2015 Elsevier Interactive Patient Education  2017 [place] Prevention in the Home Falls can cause injuries. They can happen to people of all ages. There are many things you can do to make your home safe and to help prevent falls. What can I do on the outside of my home? Regularly fix the edges of walkways and driveways and fix any cracks. Remove anything that might make you trip as you walk through a door, such as a raised step or threshold. Trim any bushes or trees on the path to your home. Use bright outdoor lighting. Clear any walking paths of anything that might make someone trip, such as rocks or tools. Regularly check to see if handrails are loose or broken. Make sure that both sides of any steps have handrails. Any raised decks and porches should have guardrails on the edges. Have any leaves, snow, or ice cleared regularly. Use sand or salt on walking paths during winter. Clean up any spills in your garage right away. This includes oil or grease spills. What can I do in the bathroom? Use night lights. Install grab bars by the toilet and in the tub and shower. Do not use towel bars as grab bars. Use non-skid mats or decals in the tub or shower. If you need to sit down in the shower, use a plastic, non-slip stool. Keep the floor dry. Clean up any water that spills on the floor as soon as it happens. Remove soap buildup in the tub or shower regularly. Attach bath mats securely with double-sided non-slip rug tape. Do not have throw rugs and other  things on the floor that can make you trip. What can I do in the bedroom? Use night lights. Make sure that you have a light by your bed that is easy to reach. Do not use any sheets or blankets that are too big for your bed. They should not hang down onto the floor. Have a firm chair that has side arms. You can use this for support while you get dressed. Do not have throw rugs and other things on the floor that can make you trip. What can I do in the kitchen? Clean up any spills right away. Avoid walking on wet floors. Keep items that you use a lot in easy-to-reach places. If you need to reach something above you, use a strong step stool that has a grab bar. Keep electrical cords out of the way. Do not use floor polish or wax that makes floors slippery. If you must use wax, use non-skid floor wax. Do not have throw rugs and other things on the floor that can make you trip. What can I do with my stairs? Do not leave any items on the stairs. Make sure that there are handrails on both sides of the stairs and use them. Fix handrails that are broken or loose. Make sure that handrails are as long as the stairways. Check any carpeting to make sure that it is firmly attached to the stairs. Fix any carpet that is loose or worn.  Avoid having throw rugs at the top or bottom of the stairs. If you do have throw rugs, attach them to the floor with carpet tape. Make sure that you have a light switch at the top of the stairs and the bottom of the stairs. If you do not have them, ask someone to add them for you. What else can I do to help prevent falls? Wear shoes that: Do not have high heels. Have rubber bottoms. Are comfortable and fit you well. Are closed at the toe. Do not wear sandals. If you use a stepladder: Make sure that it is fully opened. Do not climb a closed stepladder. Make sure that both sides of the stepladder are locked into place. Ask someone to hold it for you, if possible. Clearly  mark and make sure that you can see: Any grab bars or handrails. First and last steps. Where the edge of each step is. Use tools that help you move around (mobility aids) if they are needed. These include: Canes. Walkers. Scooters. Crutches. Turn on the lights when you go into a dark area. Replace any light bulbs as soon as they burn out. Set up your furniture so you have a clear path. Avoid moving your furniture around. If any of your floors are uneven, fix them. If there are any pets around you, be aware of where they are. Review your medicines with your doctor. Some medicines can make you feel dizzy. This can increase your chance of falling. Ask your doctor what other things that you can do to help prevent falls. This information is not intended to replace advice given to you by your health care provider. Make sure you discuss any questions you have with your health care provider. Document Released: 08/18/2009 Document Revised: 03/29/2016 Document Reviewed: 11/26/2014 Elsevier Interactive Patient Education  2017 [employer].

## 2021-12-18 NOTE — Progress Notes (Signed)
I connected with Jermaine LeveringJoseph Collins today by telephone and verified that I am speaking with the correct person using two identifiers. Location patient: home Location provider: work Persons participating in the virtual visit: Jermaine Collins, Jermaine PonderNickeah Masayoshi Couzens LPN.   I discussed the limitations, risks, security and privacy concerns of performing an evaluation and management service by telephone and the availability of in person appointments. I also discussed with the patient that there may be a patient responsible charge related to this service. The patient expressed understanding and verbally consented to this telephonic visit.    Interactive audio and video telecommunications were attempted between this provider and patient, however failed, due to patient having technical difficulties OR patient did not have access to video capability.  We continued and completed visit with audio only.     Vital signs may be patient reported or missing.  Subjective:   Jermaine Collins is a 71 y.o. male who presents for Medicare Annual/Subsequent preventive examination.  Review of Systems     Cardiac Risk Factors include: advanced age (>7155men, 50>65 women);male gender     Objective:    Today's Vitals   12/18/21 0943  Weight: 188 lb (85.3 kg)  Height: 6' (1.829 m)   Body mass index is 25.5 kg/m.  Advanced Directives 12/18/2021 12/27/2020  Does Patient Have a Medical Advance Directive? Yes Yes  Type of Estate agentAdvance Directive Healthcare Power of [place];Living will Healthcare Power of [place];Living will  Does patient want to make changes to medical advance directive? - No - Patient declined  Copy of Healthcare Power of Attorney in Chart? No - copy requested No - copy requested    Current Medications (verified) Outpatient Encounter Medications as of 12/18/2021  Medication Sig   ibuprofen (ADVIL,MOTRIN) 200 MG tablet Take 400 mg by mouth as needed.   Multiple Vitamin (MULTIVITAMIN) tablet Take 1 tablet by mouth  daily.   No facility-administered encounter medications on file as of 12/18/2021.    Allergies (verified) Patient has no known allergies.   History: History reviewed. No pertinent past medical history. Past Surgical History:  Procedure Laterality Date   COLONOSCOPY  12/02/06   spermatocele repair     VASECTOMY     Family History  Problem Relation Age of Onset   Cancer Mother        pancreas   Aortic aneurysm Father    Heart disease Father        rheumatic fever as child; valve replacement in 70's   Stroke Father [id number]   High blood pressure Brother    Hyperlipidemia Other    Hypertension Other    Heart disease Other    Stroke Other    Cancer Other        lung   Alzheimer's disease Maternal Grandmother    Heart attack Maternal Grandfather 74   Heart attack Paternal Grandmother    Heart attack Paternal Grandfather [id number]   Social History   Socioeconomic History   Marital status: Married    Spouse name: Not on file   Number of children: Not on file   Years of education: Not on file   Highest education level: Not on file  Occupational History   Not on file  Tobacco Use   Smoking status: Former    Passive exposure: Past   Smokeless tobacco: Never  Vaping Use   Vaping Use: Never used  Substance and Sexual Activity   Alcohol use: Yes    Alcohol/week: 12.0 standard drinks    Types: 12 Cans  of beer per week   Drug use: No   Sexual activity: Not on file  Other Topics Concern   Not on file  Social History Narrative   Not on file   Social Determinants of Health   Financial Resource Strain: Low Risk    Difficulty of Paying Living Expenses: Not hard at all  Food Insecurity: No Food Insecurity   Worried About Running Out of Food in the Last Year: Never true   Ran Out of Food in the Last Year: Never true  Transportation Needs: No Transportation Needs   Lack of Transportation (Medical): No   Lack of Transportation (Non-Medical): No  Physical Activity: Inactive   Days of  Exercise per Week: 0 days   Minutes of Exercise per Session: 0 min  Stress: No Stress Concern Present   Feeling of Stress : Not at all  Social Connections: Moderately Isolated   Frequency of Communication with Friends and Family: More than three times a week   Frequency of Social Gatherings with Friends and Family: Once a week   Attends Religious Services: Never   Database administrator or Organizations: No   Attends Engineer, structural: Never   Marital Status: Married    Tobacco Counseling Counseling given: Not Answered   Clinical Intake:  Pre-visit preparation completed: Yes  Pain : No/denies pain     Nutritional Status: BMI 25 -29 Overweight Nutritional Risks: None Diabetes: No  How often do you need to have someone help you when you read instructions, pamphlets, or other written materials from your doctor or pharmacy?: 1 - Never What is the last grade level you completed in school?: college  Diabetic? no  Interpreter Needed?: No  Information entered by :: NAllen LPN   Activities of Daily Living In your present state of health, do you have any difficulty performing the following activities: 12/18/2021 12/18/2021  Hearing? N N  Vision? N N  Difficulty concentrating or making decisions? N N  Walking or climbing stairs? N N  Dressing or bathing? N N  Doing errands, shopping? N N  Preparing Food and eating ? N N  Using the Toilet? N N  In the past six months, have you accidently leaked urine? N N  Do you have problems with loss of bowel control? N N  Managing your Medications? N N  Managing your Finances? N N  Housekeeping or managing your Housekeeping? N N  Some recent data might be hidden    Patient Care Team: Wynn Banker, MD as PCP - General (Family Medicine) Elmon Else, MD as Consulting Physician (Dermatology)  Indicate any recent Medical Services you may have received from other than [facility] providers in the past year (date may be  approximate).     Assessment:   This is a routine wellness examination for [name].  Hearing/Vision screen Vision Screening - Comments:: Regular eye exams, Dr. London Sheer  Dietary issues and exercise activities discussed: Current Exercise Habits: The patient does not participate in regular exercise at present   Goals Addressed             This Visit's Progress    Patient Stated       12/18/2021, stay active       Depression Screen PHQ 2/9 Scores 12/18/2021 12/27/2020 06/25/2018 01/26/2014  PHQ - 2 Score 0 0 0 0    Fall Risk Fall Risk  12/18/2021 12/18/2021 12/27/2020 06/25/2018 01/26/2014  Falls in the past year? 0 0 0 No  No  Number falls in past yr: - - 0 - -  Injury with Fall? - - 0 - -  Risk for fall due to : No Fall Risks - No Fall Risks - -  Follow up Falls evaluation completed;Education provided;Falls prevention discussed - Falls evaluation completed;Falls prevention discussed - -    FALL RISK PREVENTION PERTAINING TO THE HOME:  Any stairs in or around the home? No  If so, are there any without handrails?  N/a Home free of loose throw rugs in walkways, pet beds, electrical cords, etc? Yes  Adequate lighting in your home to reduce risk of falls? Yes   ASSISTIVE DEVICES UTILIZED TO PREVENT FALLS:  Life alert? No  Use of a cane, walker or w/c? No  Grab bars in the bathroom? Yes  Shower chair or bench in shower? No  Elevated toilet seat or a handicapped toilet? Yes   TIMED UP AND GO:  Was the test performed? No .      Cognitive Function:     6CIT Screen 12/18/2021  What Year? 0 points  What month? 0 points  What time? 0 points  Count back from 20 0 points  Months in reverse 0 points  Repeat phrase 4 points  Total Score 4    Immunizations Immunization History  Administered Date(s) Administered   Influenza Split 08/05/2013   Influenza, High Dose Seasonal PF 12/20/2017, 09/10/2018   PFIZER(Purple Top)SARS-COV-2 Vaccination 12/31/2019, 01/26/2020,  11/15/2020   Pneumococcal Conjugate-13 06/25/2018   Tdap 01/26/2014   Zoster, Live 06/07/2011    TDAP status: Up to date  Flu Vaccine status: Due, Education has been provided regarding the importance of this vaccine. Advised may receive this vaccine at local pharmacy or Health Dept. Aware to provide a copy of the vaccination record if obtained from local pharmacy or Health Dept. Verbalized acceptance and understanding.  Pneumococcal vaccine status: Due, Education has been provided regarding the importance of this vaccine. Advised may receive this vaccine at local pharmacy or Health Dept. Aware to provide a copy of the vaccination record if obtained from local pharmacy or Health Dept. Verbalized acceptance and understanding.  Covid-19 vaccine status: Completed vaccines  Qualifies for Shingles Vaccine? Yes   Zostavax completed Yes   Shingrix Completed?: No.    Education has been provided regarding the importance of this vaccine. Patient has been advised to call insurance company to determine out of pocket expense if they have not yet received this vaccine. Advised may also receive vaccine at local pharmacy or Health Dept. Verbalized acceptance and understanding.  Screening Tests Health Maintenance  Topic Date Due   Zoster Vaccines- Shingrix (1 of 2) Never done   Pneumonia Vaccine 64+ Years old (2 - PPSV23 if available, else PCV20) 06/26/2019   COVID-19 Vaccine (4 - Booster for Pfizer series) 01/03/2022 (Originally 01/10/2021)   INFLUENZA VACCINE  02/02/2022 (Originally 06/05/2021)   Fecal DNA (Cologuard)  07/22/2022   TETANUS/TDAP  01/27/2024   Hepatitis C Screening  Completed   HPV VACCINES  Aged Out   COLONOSCOPY (Pts 45-96yrs Insurance coverage will need to be confirmed)  Discontinued    Health Maintenance  Health Maintenance Due  Topic Date Due   Zoster Vaccines- Shingrix (1 of 2) Never done   Pneumonia Vaccine 9+ Years old (2 - PPSV23 if available, else PCV20) 06/26/2019     Colorectal cancer screening: Type of screening: Cologuard. Completed 07/23/2019. Repeat every 3 years  Lung Cancer Screening: (Low Dose CT Chest recommended if Age  55-80 years, 30 pack-year currently smoking OR have quit w/in 15years.) does not qualify.   Lung Cancer Screening Referral: no  Additional Screening:  Hepatitis C Screening: does qualify; Completed 06/25/2018  Vision Screening: Recommended annual ophthalmology exams for early detection of glaucoma and other disorders of the eye. Is the patient up to date with their annual eye exam?  Yes  Who is the provider or what is the name of the office in which the patient attends annual eye exams? Dr. Shea Evans If pt is not established with a provider, would they like to be referred to a provider to establish care? No .   Dental Screening: Recommended annual dental exams for proper oral hygiene  Community Resource Referral / Chronic Care Management: CRR required this visit?  No   CCM required this visit?  No      Plan:     I have personally reviewed and noted the following in the patients chart:   Medical and social history Use of alcohol, tobacco or illicit drugs  Current medications and supplements including opioid prescriptions. Patient is not currently taking opioid prescriptions. Functional ability and status Nutritional status Physical activity Advanced directives List of other physicians Hospitalizations, surgeries, and ER visits in previous 12 months Vitals Screenings to include cognitive, depression, and falls Referrals and appointments  In addition, I have reviewed and discussed with patient certain preventive protocols, quality metrics, and best practice recommendations. A written personalized care plan for preventive services as well as general preventive health recommendations were provided to patient.     Barb Merino, LPN   01/29/7123   Nurse Notes: none  Due to this being a virtual visit, the after  visit summary with patients personalized plan was offered to patient via mail or my-chart. Patient would like to access on my-chart

## 2022-01-30 DIAGNOSIS — L821 Other seborrheic keratosis: Secondary | ICD-10-CM | POA: Diagnosis not present

## 2022-01-30 DIAGNOSIS — D2271 Melanocytic nevi of right lower limb, including hip: Secondary | ICD-10-CM | POA: Diagnosis not present

## 2022-01-30 DIAGNOSIS — Z86018 Personal history of other benign neoplasm: Secondary | ICD-10-CM | POA: Diagnosis not present

## 2022-01-30 DIAGNOSIS — Z23 Encounter for immunization: Secondary | ICD-10-CM | POA: Diagnosis not present

## 2022-01-30 DIAGNOSIS — L570 Actinic keratosis: Secondary | ICD-10-CM | POA: Diagnosis not present

## 2022-01-30 DIAGNOSIS — D225 Melanocytic nevi of trunk: Secondary | ICD-10-CM | POA: Diagnosis not present

## 2022-01-30 DIAGNOSIS — L578 Other skin changes due to chronic exposure to nonionizing radiation: Secondary | ICD-10-CM | POA: Diagnosis not present

## 2022-03-30 DIAGNOSIS — H353131 Nonexudative age-related macular degeneration, bilateral, early dry stage: Secondary | ICD-10-CM | POA: Diagnosis not present

## 2022-03-30 DIAGNOSIS — H40013 Open angle with borderline findings, low risk, bilateral: Secondary | ICD-10-CM | POA: Diagnosis not present

## 2022-12-24 ENCOUNTER — Ambulatory Visit: Payer: Medicare PPO

## 2024-10-14 ENCOUNTER — Other Ambulatory Visit (HOSPITAL_COMMUNITY): Payer: Self-pay
